# Patient Record
Sex: Male | Born: 1970
Health system: Southern US, Community
[De-identification: ages and names within clinical notes are randomized; demographics above are authoritative.]

## PROBLEM LIST (undated history)

## (undated) DIAGNOSIS — M1990 Unspecified osteoarthritis, unspecified site: Secondary | ICD-10-CM

## (undated) DIAGNOSIS — G4730 Sleep apnea, unspecified: Secondary | ICD-10-CM

## (undated) DIAGNOSIS — K219 Gastro-esophageal reflux disease without esophagitis: Secondary | ICD-10-CM

## (undated) DIAGNOSIS — C801 Malignant (primary) neoplasm, unspecified: Secondary | ICD-10-CM

## (undated) DIAGNOSIS — E785 Hyperlipidemia, unspecified: Secondary | ICD-10-CM

## (undated) DIAGNOSIS — F419 Anxiety disorder, unspecified: Secondary | ICD-10-CM

## (undated) DIAGNOSIS — I10 Essential (primary) hypertension: Secondary | ICD-10-CM

## (undated) DIAGNOSIS — R7303 Prediabetes: Secondary | ICD-10-CM

## (undated) DIAGNOSIS — S069X9A Unspecified intracranial injury with loss of consciousness of unspecified duration, initial encounter: Secondary | ICD-10-CM

---

## 2002-04-03 ENCOUNTER — Inpatient Hospital Stay (HOSPITAL_COMMUNITY)
Admission: AD | Admit: 2002-04-03 | Discharge: 2002-04-11 | Payer: Self-pay | Admitting: Physical Medicine & Rehabilitation

## 2002-04-03 ENCOUNTER — Encounter: Payer: Self-pay | Admitting: Physical Medicine & Rehabilitation

## 2002-04-10 ENCOUNTER — Encounter: Payer: Self-pay | Admitting: Otolaryngology

## 2002-04-11 ENCOUNTER — Inpatient Hospital Stay (HOSPITAL_COMMUNITY): Admission: AD | Admit: 2002-04-11 | Discharge: 2002-04-12 | Payer: Self-pay | Admitting: Otolaryngology

## 2002-04-23 ENCOUNTER — Ambulatory Visit (HOSPITAL_COMMUNITY): Admission: RE | Admit: 2002-04-23 | Discharge: 2002-04-23 | Payer: Self-pay | Admitting: Otolaryngology

## 2002-04-23 ENCOUNTER — Encounter: Payer: Self-pay | Admitting: Otolaryngology

## 2002-11-13 DIAGNOSIS — S069X9A Unspecified intracranial injury with loss of consciousness of unspecified duration, initial encounter: Secondary | ICD-10-CM

## 2002-11-13 DIAGNOSIS — S069XAA Unspecified intracranial injury with loss of consciousness status unknown, initial encounter: Secondary | ICD-10-CM

## 2002-11-13 HISTORY — DX: Unspecified intracranial injury with loss of consciousness of unspecified duration, initial encounter: S06.9X9A

## 2002-11-13 HISTORY — DX: Unspecified intracranial injury with loss of consciousness status unknown, initial encounter: S06.9XAA

## 2003-04-14 HISTORY — PX: COCHLEAR IMPLANT: SUR684
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
## 2014-09-16 NOTE — Patient Instructions (Addendum)
Brendan Cook  09/16/2014                           YOUR PROCEDURE IS SCHEDULED ON:  09/29/14                ENTER FROM [address] -  ENTER THRU EMERGENCY ENTRANCE                 FOLLOW  SIGNS TO SHORT STAY CENTER                 ARRIVE AT SHORT STAY AT:  5:45 am               CALL THIS NUMBER IF ANY PROBLEMS THE DAY OF SURGERY :               [phone]                                REMEMBER:   Do not eat food or drink liquids AFTER MIDNIGHT                  Take these medicines the morning of surgery with               A SIPS OF WATER :   VENLAFAXINE / FENOFIBRATE / ZYRTEC / FLUTICASONE NASAL SPRAY               BRING C PAP MASK AND TU BING TO HOSPITAL      Do not wear jewelry, make-up   Do not wear lotions, powders, or perfumes.   Do not shave legs or underarms 12 hrs. before surgery (men may shave face)  Do not bring valuables to the hospital.  Contacts, dentures or bridgework may not be worn into surgery.  Leave suitcase in the car. After surgery it may be brought to your room.  For patients admitted to the hospital more than one night, checkout time is            11:00 AM                                                         ________________________________________________________________________                                                                                                  Brendan Cook  Before surgery, you can play an important role.  Because skin is not sterile, your skin needs to be as free of germs as possible.  You can reduce the number of germs on your skin by washing with CHG (chlorahexidine gluconate) soap before surgery.  CHG is an antiseptic cleaner which kills germs and bonds with the skin to continue killing germs even after washing. Please DO NOT use if you have  an allergy to CHG or antibacterial soaps.  If your skin becomes reddened/irritated stop using the CHG and inform your nurse when you  arrive at Short Stay. Do not shave (including legs and underarms) for at least 48 hours prior to the first CHG shower.  You may shave your face. Please follow these instructions carefully:   1.  Shower with CHG Soap the night before surgery and the  morning of Surgery.   2.  If you choose to wash your hair, wash your hair first as usual with your  normal  Shampoo.   3.  After you shampoo, rinse your hair and body thoroughly to remove the  shampoo.                                         4.  Use CHG as you would any other liquid soap.  You can apply chg directly  to the skin and wash . Gently wash with scrungie or clean wascloth    5.  Apply the CHG Soap to your body ONLY FROM THE NECK DOWN.   Do not use on open                           Wound or open sores. Avoid contact with eyes, ears mouth and genitals (private parts).                        Genitals (private parts) with your normal soap.              6.  Wash thoroughly, paying special attention to the area where your surgery  will be performed.   7.  Thoroughly rinse your body with warm water from the neck down.   8.  DO NOT shower/wash with your normal soap after using and rinsing off  the CHG Soap .                9.  Pat yourself dry with a clean towel.             10.  Wear clean pajamas.             11.  Place clean sheets on your bed the night of your first shower and do not  sleep with pets.  Day of Surgery : Do not apply any lotions/deodorants the morning of surgery.  Please wear clean clothes to the hospital/surgery center.  FAILURE TO FOLLOW THESE INSTRUCTIONS MAY RESULT IN THE CANCELLATION OF YOUR SURGERY    PATIENT SIGNATURE_________________________________  ______________________________________________________________________     Brendan Cook  An incentive spirometer is a tool that can help keep your lungs clear and active. This tool measures how well you are filling your lungs with each breath.  Taking long deep breaths may help reverse or decrease the chance of developing breathing (pulmonary) problems (especially infection) following:  A long period of time when you are unable to move or be active. BEFORE THE PROCEDURE   If the spirometer includes an indicator to show your best effort, your nurse or respiratory therapist will set it to a desired goal.  If possible, sit up straight or lean slightly forward. Try not to slouch.  Hold the incentive spirometer in an upright position. INSTRUCTIONS FOR USE   Sit on the edge of your bed if possible,  or sit up as far as you can in bed or on a chair.  Hold the incentive spirometer in an upright position.  Breathe out normally.  Place the mouthpiece in your mouth and seal your lips tightly around it.  Breathe in slowly and as deeply as possible, raising the piston or the ball toward the top of the column.  Hold your breath for 3-5 seconds or for as long as possible. Allow the piston or ball to fall to the bottom of the column.  Remove the mouthpiece from your mouth and breathe out normally.  Rest for a few seconds and repeat Steps 1 through 7 at least 10 times every 1-2 hours when you are awake. Take your time and take a few normal breaths between deep breaths.  The spirometer may include an indicator to show your best effort. Use the indicator as a goal to work toward during each repetition.  After each set of 10 deep breaths, practice coughing to be sure your lungs are clear. If you have an incision (the cut made at the time of surgery), support your incision when coughing by placing a pillow or rolled up towels firmly against it. Once you are able to get out of bed, walk around indoors and cough well. You may stop using the incentive spirometer when instructed by your caregiver.  RISKS AND COMPLICATIONS  Take your time so you do not get dizzy or light-headed.  If you are in pain, you may need to take or ask for pain medication  before doing incentive spirometry. It is harder to take a deep breath if you are having pain. AFTER USE  Rest and breathe slowly and easily.  It can be helpful to keep track of a log of your progress. Your caregiver can provide you with a simple table to help with this. If you are using the spirometer at home, follow these instructions: [place] IF:   You are having difficultly using the spirometer.  You have trouble using the spirometer as often as instructed.  Your pain medication is not giving enough relief while using the spirometer.  You develop fever of 100.5 F (38.1 C) or higher. SEEK IMMEDIATE MEDICAL CARE IF:   You cough up bloody sputum that had not been present before.  You develop fever of 102 F (38.9 C) or greater.  You develop worsening pain at or near the incision site. MAKE SURE YOU:   Understand these instructions.  Will watch your condition.  Will get help right away if you are not doing well or get worse. Document Released: 03/12/2007 Document Revised: 01/22/2012 Document Reviewed: 05/13/2007 [employer] Patient Information 2014 [employer], [place].   ________________________________________________________________________  WHAT IS A BLOOD TRANSFUSION? Blood Transfusion Information  A transfusion is the replacement of blood or some of its parts. Blood is made up of multiple cells which provide different functions.  Red blood cells carry oxygen and are used for blood loss replacement.  White blood cells fight against infection.  Platelets control bleeding.  Plasma helps clot blood.  Other blood products are available for specialized needs, such as hemophilia or other clotting disorders. BEFORE THE TRANSFUSION  Who gives blood for transfusions?   Healthy volunteers who are fully evaluated to make sure their blood is safe. This is blood bank blood. Transfusion therapy is the safest it has ever been in the practice of medicine. Before blood is  taken from a donor, a complete history is taken to make sure that  person has no history of diseases nor engages in risky social behavior (examples are intravenous drug use or sexual activity with multiple partners). The donor's travel history is screened to minimize risk of transmitting infections, such as malaria. The donated blood is tested for signs of infectious diseases, such as HIV and hepatitis. The blood is then tested to be sure it is compatible with you in order to minimize the chance of a transfusion reaction. If you or a relative donates blood, this is often done in anticipation of surgery and is not appropriate for emergency situations. It takes many days to process the donated blood. RISKS AND COMPLICATIONS Although transfusion therapy is very safe and saves many lives, the main dangers of transfusion include:   Getting an infectious disease.  Developing a transfusion reaction. This is an allergic reaction to something in the blood you were given. Every precaution is taken to prevent this. The decision to have a blood transfusion has been considered carefully by your caregiver before blood is given. Blood is not given unless the benefits outweigh the risks. AFTER THE TRANSFUSION  Right after receiving a blood transfusion, you will usually feel much better and more energetic. This is especially true if your red blood cells have gotten low (anemic). The transfusion raises the level of the red blood cells which carry oxygen, and this usually causes an energy increase.  The nurse administering the transfusion will monitor you carefully for complications. HOME CARE INSTRUCTIONS  No special instructions are needed after a transfusion. You may find your energy is better. Speak with your caregiver about any limitations on activity for underlying diseases you may have. SEEK MEDICAL CARE IF:   Your condition is not improving after your transfusion.  You develop redness or irritation at the  intravenous (IV) site. SEEK IMMEDIATE MEDICAL CARE IF:  Any of the following symptoms occur over the next 12 hours:  Shaking chills.  You have a temperature by mouth above 102 F (38.9 C), not controlled by medicine.  Chest, back, or muscle pain.  People around you feel you are not acting correctly or are confused.  Shortness of breath or difficulty breathing.  Dizziness and fainting.  You get a rash or develop hives.  You have a decrease in urine output.  Your urine turns a dark color or changes to pink, red, or brown. Any of the following symptoms occur over the next 10 days:  You have a temperature by mouth above 102 F (38.9 C), not controlled by medicine.  Shortness of breath.  Weakness after normal activity.  The white part of the eye turns yellow (jaundice).  You have a decrease in the amount of urine or are urinating less often.  Your urine turns a dark color or changes to pink, red, or brown. Document Released: 10/27/2000 Document Revised: 01/22/2012 Document Reviewed: 06/15/2008 [facility] Patient Information 2014 [place], [place].  _______________________________________________________________________

## 2014-09-17 ENCOUNTER — Encounter (HOSPITAL_COMMUNITY)
Admission: RE | Admit: 2014-09-17 | Discharge: 2014-09-17 | Disposition: A | Discharge status: Home / Self Care | Payer: No Typology Code available for payment source | Source: Ambulatory Visit | Attending: Orthopedic Surgery | Admitting: Orthopedic Surgery

## 2014-09-17 ENCOUNTER — Encounter (HOSPITAL_COMMUNITY): Payer: Self-pay

## 2014-09-17 DIAGNOSIS — Z01812 Encounter for preprocedural laboratory examination: Secondary | ICD-10-CM | POA: Diagnosis present

## 2014-09-17 HISTORY — DX: Gastro-esophageal reflux disease without esophagitis: K21.9

## 2014-09-17 HISTORY — DX: Sleep apnea, unspecified: G47.30

## 2014-09-17 HISTORY — DX: Unspecified osteoarthritis, unspecified site: M19.90

## 2014-09-17 HISTORY — DX: Anxiety disorder, unspecified: F41.9

## 2014-09-17 HISTORY — DX: Hyperlipidemia, unspecified: E78.5

## 2014-09-17 HISTORY — DX: Unspecified intracranial injury with loss of consciousness of unspecified duration, initial encounter: S06.9X9A

## 2014-09-17 LAB — URINALYSIS, ROUTINE W REFLEX MICROSCOPIC
Bilirubin Urine: NEGATIVE
GLUCOSE, UA: NEGATIVE mg/dL
HGB URINE DIPSTICK: NEGATIVE
Ketones, ur: NEGATIVE mg/dL
Leukocytes, UA: NEGATIVE
Nitrite: NEGATIVE
PH: 6 (ref 5.0–8.0)
Protein, ur: NEGATIVE mg/dL
Specific Gravity, Urine: 1.012 (ref 1.005–1.030)
Urobilinogen, UA: 0.2 mg/dL (ref 0.0–1.0)

## 2014-09-17 LAB — SURGICAL PCR SCREEN
MRSA, PCR: NEGATIVE
Staphylococcus aureus: NEGATIVE

## 2014-09-17 LAB — CBC
HEMATOCRIT: 41 % (ref 39.0–52.0)
Hemoglobin: 12.8 g/dL — ABNORMAL LOW (ref 13.0–17.0)
MCH: 25.3 pg — AB (ref 26.0–34.0)
MCHC: 31.2 g/dL (ref 30.0–36.0)
MCV: 81 fL (ref 78.0–100.0)
PLATELETS: 279 10*3/uL (ref 150–400)
RBC: 5.06 MIL/uL (ref 4.22–5.81)
RDW: 15.8 % — ABNORMAL HIGH (ref 11.5–15.5)
WBC: 5.6 10*3/uL (ref 4.0–10.5)

## 2014-09-17 LAB — BASIC METABOLIC PANEL
Anion gap: 12 (ref 5–15)
BUN: 11 mg/dL (ref 6–23)
CO2: 24 meq/L (ref 19–32)
Calcium: 9.1 mg/dL (ref 8.4–10.5)
Chloride: 102 mEq/L (ref 96–112)
Creatinine, Ser: 0.86 mg/dL (ref 0.50–1.35)
GFR calc Af Amer: >90 mL/min (ref >90)
GLUCOSE: 102 mg/dL — AB (ref 70–99)
POTASSIUM: 4.1 meq/L (ref 3.7–5.3)
Sodium: 138 mEq/L (ref 137–147)

## 2014-09-17 LAB — PROTIME-INR
INR: 0.92 (ref 0.00–1.49)
Prothrombin Time: 12.5 seconds (ref 11.6–15.2)

## 2014-09-17 LAB — APTT: aPTT: 29 seconds (ref 24–37)

## 2014-09-28 NOTE — Anesthesia Preprocedure Evaluation (Addendum)
Anesthesia Evaluation  Patient identified by MRN, date of birth, ID band Patient awake    Reviewed: Allergy & Precautions, H&P , NPO status , Patient's Chart, lab work & pertinent test results  Airway Mallampati: III  TM Distance: >3 FB Neck ROM: full    Dental no notable dental hx. (+) Dental Advisory Given, Teeth Intact   Pulmonary sleep apnea and Continuous Positive Airway Pressure Ventilation ,  breath sounds clear to auscultation  Pulmonary exam normal       Cardiovascular Exercise Tolerance: Good negative cardio ROS  Rhythm:regular Rate:Normal     Neuro/Psych Traumatic brain injury 2004. Cochlear implant 2004 negative psych ROS   GI/Hepatic negative GI ROS, Neg liver ROS,   Endo/Other  negative endocrine ROS  Renal/GU negative Renal ROS  negative genitourinary   Musculoskeletal   Abdominal   Peds  Hematology negative hematology ROS (+)   Anesthesia Other Findings   Reproductive/Obstetrics negative OB ROS                           Anesthesia Physical Anesthesia Plan  ASA: III  Anesthesia Plan: Spinal   Post-op Pain Management:    Induction:   Airway Management Planned: Simple Face Mask  Additional Equipment:   Intra-op Plan:   Post-operative Plan:   Informed Consent: I have reviewed the patients History and Physical, chart, labs and discussed the procedure including the risks, benefits and alternatives for the proposed anesthesia with the patient or authorized representative who has indicated his/her understanding and acceptance.   Dental Advisory Given  Plan Discussed with: CRNA and Surgeon  Anesthesia Plan Comments:         Anesthesia Quick Evaluation

## 2014-09-29 ENCOUNTER — Inpatient Hospital Stay (HOSPITAL_COMMUNITY)
Admission: RE | Admit: 2014-09-29 | Discharge: 2014-09-30 | DRG: 470 | Disposition: A | Discharge status: Home Health | Payer: No Typology Code available for payment source | Source: Ambulatory Visit | Attending: Orthopedic Surgery | Admitting: Orthopedic Surgery

## 2014-09-29 ENCOUNTER — Inpatient Hospital Stay (HOSPITAL_COMMUNITY): Payer: No Typology Code available for payment source | Admitting: Anesthesiology

## 2014-09-29 ENCOUNTER — Encounter (HOSPITAL_COMMUNITY)
Admission: RE | Disposition: A | Discharge status: Home Health | Payer: Self-pay | Source: Ambulatory Visit | Attending: Orthopedic Surgery

## 2014-09-29 ENCOUNTER — Inpatient Hospital Stay (HOSPITAL_COMMUNITY): Payer: No Typology Code available for payment source

## 2014-09-29 ENCOUNTER — Encounter (HOSPITAL_COMMUNITY): Payer: Self-pay | Admitting: *Deleted

## 2014-09-29 DIAGNOSIS — M87852 Other osteonecrosis, left femur: Secondary | ICD-10-CM | POA: Diagnosis present

## 2014-09-29 DIAGNOSIS — Z96649 Presence of unspecified artificial hip joint: Secondary | ICD-10-CM

## 2014-09-29 DIAGNOSIS — M1612 Unilateral primary osteoarthritis, left hip: Principal | ICD-10-CM | POA: Diagnosis present

## 2014-09-29 DIAGNOSIS — F419 Anxiety disorder, unspecified: Secondary | ICD-10-CM | POA: Diagnosis present

## 2014-09-29 DIAGNOSIS — E785 Hyperlipidemia, unspecified: Secondary | ICD-10-CM | POA: Diagnosis present

## 2014-09-29 DIAGNOSIS — Z96642 Presence of left artificial hip joint: Secondary | ICD-10-CM

## 2014-09-29 DIAGNOSIS — K219 Gastro-esophageal reflux disease without esophagitis: Secondary | ICD-10-CM | POA: Diagnosis present

## 2014-09-29 DIAGNOSIS — E663 Overweight: Secondary | ICD-10-CM | POA: Diagnosis present

## 2014-09-29 DIAGNOSIS — M25552 Pain in left hip: Secondary | ICD-10-CM | POA: Diagnosis present

## 2014-09-29 HISTORY — PX: TOTAL HIP ARTHROPLASTY: SHX124

## 2014-09-29 LAB — TYPE AND SCREEN
ABO/RH(D): A POS
ANTIBODY SCREEN: NEGATIVE

## 2014-09-29 LAB — ABO/RH: ABO/RH(D): A POS

## 2014-09-29 SURGERY — ARTHROPLASTY, HIP, TOTAL, ANTERIOR APPROACH
Anesthesia: Spinal | Site: Hip | Laterality: Left

## 2014-09-29 MED ORDER — CELECOXIB 200 MG PO CAPS
200.0000 mg | ORAL_CAPSULE | Freq: Two times a day (BID) | ORAL | Status: DC
  Administered 2014-09-29 – 2014-09-30 (×2): 200 mg via ORAL
  Filled 2014-09-29 (×4): qty 1

## 2014-09-29 MED ORDER — ALPRAZOLAM 0.5 MG PO TABS: 0.5000 mg | ORAL_TABLET | Freq: Every evening | ORAL | Status: DC | PRN

## 2014-09-29 MED ORDER — ATORVASTATIN CALCIUM 20 MG PO TABS
20.0000 mg | ORAL_TABLET | Freq: Every day | ORAL | Status: DC
  Administered 2014-09-29 – 2014-09-30 (×2): 20 mg via ORAL
  Filled 2014-09-29 (×2): qty 1

## 2014-09-29 MED ORDER — MIDAZOLAM HCL 2 MG/2ML IJ SOLN
INTRAMUSCULAR | Status: AC
  Filled 2014-09-29: qty 2

## 2014-09-29 MED ORDER — PROPOFOL INFUSION 10 MG/ML OPTIME
INTRAVENOUS | Status: DC | PRN
  Administered 2014-09-29: 100 ug/kg/min via INTRAVENOUS

## 2014-09-29 MED ORDER — VENLAFAXINE HCL ER 150 MG PO CP24
150.0000 mg | ORAL_CAPSULE | Freq: Every day | ORAL | Status: DC
  Administered 2014-09-30: 150 mg via ORAL
  Filled 2014-09-29 (×2): qty 1

## 2014-09-29 MED ORDER — CEFAZOLIN SODIUM-DEXTROSE 2-3 GM-% IV SOLR
2.0000 g | Freq: Four times a day (QID) | INTRAVENOUS | Status: AC
  Administered 2014-09-29 (×2): 2 g via INTRAVENOUS
  Filled 2014-09-29 (×2): qty 50

## 2014-09-29 MED ORDER — CHLORHEXIDINE GLUCONATE 4 % EX LIQD: 60.0000 mL | Freq: Once | CUTANEOUS | Status: DC

## 2014-09-29 MED ORDER — LACTATED RINGERS IV SOLN: INTRAVENOUS | Status: DC | PRN

## 2014-09-29 MED ORDER — METHOCARBAMOL 500 MG PO TABS
500.0000 mg | ORAL_TABLET | Freq: Four times a day (QID) | ORAL | Status: DC | PRN
  Administered 2014-09-29 – 2014-09-30 (×2): 500 mg via ORAL
  Filled 2014-09-29 (×2): qty 1

## 2014-09-29 MED ORDER — TRANEXAMIC ACID 100 MG/ML IV SOLN
1000.0000 mg | Freq: Once | INTRAVENOUS | Status: AC
  Administered 2014-09-29: 1000 mg via INTRAVENOUS
  Filled 2014-09-29: qty 10

## 2014-09-29 MED ORDER — ONDANSETRON HCL 4 MG PO TABS: 4.0000 mg | ORAL_TABLET | Freq: Four times a day (QID) | ORAL | Status: DC | PRN

## 2014-09-29 MED ORDER — CEFAZOLIN SODIUM-DEXTROSE 2-3 GM-% IV SOLR
2.0000 g | INTRAVENOUS | Status: AC
  Administered 2014-09-29: 2000 mg via INTRAVENOUS

## 2014-09-29 MED ORDER — FENOFIBRATE 160 MG PO TABS
80.0000 mg | ORAL_TABLET | Freq: Every morning | ORAL | Status: DC
  Administered 2014-09-29 – 2014-09-30 (×2): 80 mg via ORAL
  Filled 2014-09-29 (×2): qty 0.5

## 2014-09-29 MED ORDER — FLUTICASONE PROPIONATE 50 MCG/ACT NA SUSP
1.0000 | Freq: Every day | NASAL | Status: DC
  Administered 2014-09-30: 1 via NASAL
  Filled 2014-09-29: qty 16

## 2014-09-29 MED ORDER — MENTHOL 3 MG MT LOZG
1.0000 | LOZENGE | OROMUCOSAL | Status: DC | PRN
  Filled 2014-09-29: qty 9

## 2014-09-29 MED ORDER — POLYETHYLENE GLYCOL 3350 17 G PO PACK: 17.0000 g | PACK | Freq: Every day | ORAL | Status: DC | PRN

## 2014-09-29 MED ORDER — DOCUSATE SODIUM 100 MG PO CAPS
100.0000 mg | ORAL_CAPSULE | Freq: Two times a day (BID) | ORAL | Status: DC
  Administered 2014-09-29 – 2014-09-30 (×2): 100 mg via ORAL

## 2014-09-29 MED ORDER — ONDANSETRON HCL 4 MG/2ML IJ SOLN
INTRAMUSCULAR | Status: DC | PRN
  Administered 2014-09-29: 4 mg via INTRAVENOUS

## 2014-09-29 MED ORDER — ALUM & MAG HYDROXIDE-SIMETH 200-200-20 MG/5ML PO SUSP: 30.0000 mL | ORAL | Status: DC | PRN

## 2014-09-29 MED ORDER — BUPIVACAINE HCL (PF) 0.5 % IJ SOLN
INTRAMUSCULAR | Status: DC | PRN
  Administered 2014-09-29: 3 mL

## 2014-09-29 MED ORDER — METOCLOPRAMIDE HCL 10 MG PO TABS: 5.0000 mg | ORAL_TABLET | Freq: Three times a day (TID) | ORAL | Status: DC | PRN

## 2014-09-29 MED ORDER — PROPOFOL 10 MG/ML IV BOLUS
INTRAVENOUS | Status: DC | PRN
  Administered 2014-09-29: 100 mg via INTRAVENOUS

## 2014-09-29 MED ORDER — DEXAMETHASONE SODIUM PHOSPHATE 10 MG/ML IJ SOLN
10.0000 mg | Freq: Once | INTRAMUSCULAR | Status: AC
  Administered 2014-09-29: 10 mg via INTRAVENOUS

## 2014-09-29 MED ORDER — ZOLPIDEM TARTRATE 5 MG PO TABS: 5.0000 mg | ORAL_TABLET | Freq: Every evening | ORAL | Status: DC | PRN

## 2014-09-29 MED ORDER — DIPHENHYDRAMINE HCL 25 MG PO CAPS: 25.0000 mg | ORAL_CAPSULE | Freq: Four times a day (QID) | ORAL | Status: DC | PRN

## 2014-09-29 MED ORDER — FENTANYL CITRATE 0.05 MG/ML IJ SOLN
INTRAMUSCULAR | Status: AC
  Filled 2014-09-29: qty 2

## 2014-09-29 MED ORDER — BISACODYL 10 MG RE SUPP: 10.0000 mg | Freq: Every day | RECTAL | Status: DC | PRN

## 2014-09-29 MED ORDER — SODIUM CHLORIDE 0.9 % IV SOLN
INTRAVENOUS | Status: DC
  Administered 2014-09-29: 13:00:00 via INTRAVENOUS
  Filled 2014-09-29 (×3): qty 1000

## 2014-09-29 MED ORDER — ONDANSETRON HCL 4 MG/2ML IJ SOLN: 4.0000 mg | Freq: Four times a day (QID) | INTRAMUSCULAR | Status: DC | PRN

## 2014-09-29 MED ORDER — PROPOFOL 10 MG/ML IV BOLUS
INTRAVENOUS | Status: AC
  Filled 2014-09-29: qty 20

## 2014-09-29 MED ORDER — ACETAMINOPHEN 650 MG RE SUPP
650.0000 mg | Freq: Four times a day (QID) | RECTAL | Status: DC | PRN
Start: 2014-09-29 — End: 2014-09-30

## 2014-09-29 MED ORDER — CEFAZOLIN SODIUM-DEXTROSE 2-3 GM-% IV SOLR
INTRAVENOUS | Status: AC
  Filled 2014-09-29: qty 50

## 2014-09-29 MED ORDER — DEXAMETHASONE SODIUM PHOSPHATE 10 MG/ML IJ SOLN
INTRAMUSCULAR | Status: AC
  Filled 2014-09-29: qty 1

## 2014-09-29 MED ORDER — PANTOPRAZOLE SODIUM 40 MG PO TBEC
40.0000 mg | DELAYED_RELEASE_TABLET | Freq: Every day | ORAL | Status: DC
  Administered 2014-09-30: 40 mg via ORAL
  Filled 2014-09-29: qty 1

## 2014-09-29 MED ORDER — LACTATED RINGERS IV SOLN: INTRAVENOUS | Status: DC

## 2014-09-29 MED ORDER — METHOCARBAMOL 1000 MG/10ML IJ SOLN
500.0000 mg | Freq: Four times a day (QID) | INTRAMUSCULAR | Status: DC | PRN
  Administered 2014-09-29: 500 mg via INTRAVENOUS
  Filled 2014-09-29 (×2): qty 5

## 2014-09-29 MED ORDER — ASPIRIN EC 325 MG PO TBEC
325.0000 mg | DELAYED_RELEASE_TABLET | Freq: Two times a day (BID) | ORAL | Status: DC
  Administered 2014-09-30: 325 mg via ORAL
  Filled 2014-09-29 (×3): qty 1

## 2014-09-29 MED ORDER — FENTANYL CITRATE 0.05 MG/ML IJ SOLN
INTRAMUSCULAR | Status: DC | PRN
  Administered 2014-09-29: 100 ug via INTRAVENOUS

## 2014-09-29 MED ORDER — LACTATED RINGERS IV SOLN
INTRAVENOUS | Status: DC
Start: 2014-09-29 — End: 2014-09-29

## 2014-09-29 MED ORDER — ONDANSETRON HCL 4 MG/2ML IJ SOLN
INTRAMUSCULAR | Status: AC
  Filled 2014-09-29: qty 2

## 2014-09-29 MED ORDER — ACETAMINOPHEN 500 MG PO TABS: 1000.0000 mg | ORAL_TABLET | Freq: Four times a day (QID) | ORAL | Status: DC

## 2014-09-29 MED ORDER — HYDROMORPHONE HCL 1 MG/ML IJ SOLN
0.5000 mg | INTRAMUSCULAR | Status: DC | PRN
  Administered 2014-09-29: 1 mg via INTRAVENOUS
  Administered 2014-09-29: 2 mg via INTRAVENOUS
  Filled 2014-09-29: qty 1
  Filled 2014-09-29: qty 2

## 2014-09-29 MED ORDER — METOCLOPRAMIDE HCL 5 MG/ML IJ SOLN: 5.0000 mg | Freq: Three times a day (TID) | INTRAMUSCULAR | Status: DC | PRN

## 2014-09-29 MED ORDER — PHENOL 1.4 % MT LIQD
1.0000 | OROMUCOSAL | Status: DC | PRN
Start: 2014-09-29 — End: 2014-09-30
  Filled 2014-09-29: qty 177

## 2014-09-29 MED ORDER — PHENYLEPHRINE HCL 10 MG/ML IJ SOLN
INTRAMUSCULAR | Status: AC
  Filled 2014-09-29: qty 1

## 2014-09-29 MED ORDER — BUPIVACAINE HCL (PF) 0.5 % IJ SOLN
INTRAMUSCULAR | Status: AC
  Filled 2014-09-29: qty 30

## 2014-09-29 MED ORDER — 0.9 % SODIUM CHLORIDE (POUR BTL) OPTIME
TOPICAL | Status: DC | PRN
  Administered 2014-09-29: 1000 mL

## 2014-09-29 MED ORDER — PHENYLEPHRINE 40 MCG/ML (10ML) SYRINGE FOR IV PUSH (FOR BLOOD PRESSURE SUPPORT)
PREFILLED_SYRINGE | INTRAVENOUS | Status: AC
  Filled 2014-09-29: qty 10

## 2014-09-29 MED ORDER — OMEPRAZOLE MAGNESIUM 20 MG PO TBEC: 20.0000 mg | DELAYED_RELEASE_TABLET | Freq: Every day | ORAL | Status: DC

## 2014-09-29 MED ORDER — LORATADINE 10 MG PO TABS
10.0000 mg | ORAL_TABLET | Freq: Every day | ORAL | Status: DC
  Administered 2014-09-29 – 2014-09-30 (×2): 10 mg via ORAL
  Filled 2014-09-29 (×2): qty 1

## 2014-09-29 MED ORDER — MIDAZOLAM HCL 5 MG/5ML IJ SOLN
INTRAMUSCULAR | Status: DC | PRN
  Administered 2014-09-29: 2 mg via INTRAVENOUS

## 2014-09-29 MED ORDER — ACETAMINOPHEN 325 MG PO TABS: 650.0000 mg | ORAL_TABLET | Freq: Four times a day (QID) | ORAL | Status: DC | PRN

## 2014-09-29 MED ORDER — LACTATED RINGERS IV SOLN
INTRAVENOUS | Status: DC | PRN
  Administered 2014-09-29 (×2): via INTRAVENOUS

## 2014-09-29 MED ORDER — DEXAMETHASONE SODIUM PHOSPHATE 10 MG/ML IJ SOLN
10.0000 mg | Freq: Once | INTRAMUSCULAR | Status: AC
  Administered 2014-09-30: 10 mg via INTRAVENOUS
  Filled 2014-09-29: qty 1

## 2014-09-29 MED ORDER — HYDROCODONE-ACETAMINOPHEN 7.5-325 MG PO TABS
1.0000 | ORAL_TABLET | ORAL | Status: DC
  Administered 2014-09-29 – 2014-09-30 (×6): 2 via ORAL
  Filled 2014-09-29 (×6): qty 2

## 2014-09-29 MED ORDER — HYDROMORPHONE HCL 1 MG/ML IJ SOLN: 0.2500 mg | INTRAMUSCULAR | Status: DC | PRN

## 2014-09-29 MED ORDER — MAGNESIUM CITRATE PO SOLN: 1.0000 | Freq: Once | ORAL | Status: AC | PRN

## 2014-09-29 SURGICAL SUPPLY — 43 items
BAG SPEC THK2 15X12 ZIP CLS (MISCELLANEOUS)
BAG ZIPLOCK 12X15 (MISCELLANEOUS) IMPLANT
CAPT HIP POROUS CUP W/CERAMAX ×2 IMPLANT
CAPT HIP PRIMARY STEM/CERM HD ×2 IMPLANT
COVER PERINEAL POST (MISCELLANEOUS) ×3 IMPLANT
DRAPE C-ARM 42X120 X-RAY (DRAPES) ×3 IMPLANT
DRAPE STERI IOBAN 125X83 (DRAPES) ×3 IMPLANT
DRAPE U-SHAPE 47X51 STRL (DRAPES) ×9 IMPLANT
DRSG AQUACEL AG ADV 3.5X10 (GAUZE/BANDAGES/DRESSINGS) ×3 IMPLANT
DURAPREP 26ML APPLICATOR (WOUND CARE) ×3 IMPLANT
ELECT BLADE TIP CTD 4 INCH (ELECTRODE) ×3 IMPLANT
ELECT REM PT RETURN 9FT ADLT (ELECTROSURGICAL) ×3
ELECTRODE REM PT RTRN 9FT ADLT (ELECTROSURGICAL) ×1 IMPLANT
FACESHIELD WRAPAROUND (MASK) ×12 IMPLANT
FACESHIELD WRAPAROUND OR TEAM (MASK) ×4 IMPLANT
GLOVE BIO SURGEON STRL SZ7.5 (GLOVE) ×2 IMPLANT
GLOVE BIOGEL M 7.0 STRL (GLOVE) ×2 IMPLANT
GLOVE BIOGEL M STRL SZ7.5 (GLOVE) ×2 IMPLANT
GLOVE BIOGEL PI IND STRL 6.5 (GLOVE) IMPLANT
GLOVE BIOGEL PI IND STRL 7.5 (GLOVE) ×1 IMPLANT
GLOVE BIOGEL PI INDICATOR 6.5 (GLOVE) ×2
GLOVE BIOGEL PI INDICATOR 7.5 (GLOVE) ×8
GLOVE ORTHO TXT STRL SZ7.5 (GLOVE) ×3 IMPLANT
GLOVE SURG SS PI 7.5 STRL IVOR (GLOVE) ×2 IMPLANT
GOWN BRE IMP PREV XXLGXLNG (GOWN DISPOSABLE) ×2 IMPLANT
GOWN STRL REUS W/ TWL XL LVL3 (GOWN DISPOSABLE) IMPLANT
GOWN STRL REUS W/TWL LRG LVL3 (GOWN DISPOSABLE) ×3 IMPLANT
GOWN STRL REUS W/TWL XL LVL3 (GOWN DISPOSABLE) ×6
HOLDER FOLEY CATH W/STRAP (MISCELLANEOUS) ×3 IMPLANT
KIT BASIN OR (CUSTOM PROCEDURE TRAY) ×3 IMPLANT
LIQUID BAND (GAUZE/BANDAGES/DRESSINGS) ×3 IMPLANT
PACK TOTAL JOINT (CUSTOM PROCEDURE TRAY) ×3 IMPLANT
SAW OSC TIP CART 19.5X105X1.3 (SAW) ×3 IMPLANT
SCREW 6.5MMX30MM (Screw) ×2 IMPLANT
SUT MNCRL AB 4-0 PS2 18 (SUTURE) ×3 IMPLANT
SUT VIC AB 1 CT1 36 (SUTURE) ×9 IMPLANT
SUT VIC AB 2-0 CT1 27 (SUTURE) ×6
SUT VIC AB 2-0 CT1 TAPERPNT 27 (SUTURE) ×2 IMPLANT
SUT VLOC 180 0 24IN GS25 (SUTURE) ×3 IMPLANT
TOWEL OR 17X26 10 PK STRL BLUE (TOWEL DISPOSABLE) ×3 IMPLANT
TOWEL OR NON WOVEN STRL DISP B (DISPOSABLE) ×2 IMPLANT
TRAY FOLEY CATH 16FRSI W/METER (SET/KITS/TRAYS/PACK) ×2 IMPLANT
WATER STERILE IRR 1500ML POUR (IV SOLUTION) ×3 IMPLANT

## 2014-09-29 NOTE — Plan of Care (Signed)
Problem: Phase I Progression Outcomes Goal: Hemodynamically stable Outcome: Completed/Met Date Met:  09/29/14     

## 2014-09-29 NOTE — Progress Notes (Signed)
RT checked with patient about CPAP. Patient stated that he would prefer not to wear one while here. RT encouraged patient to let RN know if he changed his mind.

## 2014-09-29 NOTE — Anesthesia Postprocedure Evaluation (Signed)
  Anesthesia Post-op Note  Patient: Brendan Cook  Procedure(s) Performed: Procedure(s) (LRB): LEFT TOTAL HIP ARTHROPLASTY ANTERIOR APPROACH (Left)  Patient Location: PACU  Anesthesia Type: General  Level of Consciousness: awake and alert   Airway and Oxygen Therapy: Patient Spontanous Breathing  Post-op Pain: mild  Post-op Assessment: Post-op Vital signs reviewed, Patient's Cardiovascular Status Stable, Respiratory Function Stable, Patent Airway and No signs of Nausea or vomiting  Last Vitals:  Filed Vitals:   09/29/14 1150  BP: 124/82  Pulse: 81  Temp: 36.7 C  Resp: 14    Post-op Vital Signs: stable   Complications: No apparent anesthesia complications

## 2014-09-29 NOTE — Anesthesia Procedure Notes (Signed)
Spinal Patient location during procedure: OR End time: 09/29/2014 8:41 AM Staffing Resident/CRNA: Noralyn Pick Performed by: anesthesiologist and resident/CRNA  Preanesthetic Checklist Completed: patient identified, site marked, surgical consent, pre-op evaluation, timeout performed, IV checked, risks and benefits discussed and monitors and equipment checked Spinal Block Patient position: sitting Prep: Betadine Patient monitoring: heart rate, continuous pulse ox and blood pressure Approach: midline Location: L2-3 Injection technique: single-shot Needle Needle type: Sprotte  Needle gauge: 24 G Needle length: 9 cm Assessment Sensory level: T6 Additional Notes Expiration date of kit checked and confirmed. Patient tolerated procedure well, without complications.

## 2014-09-29 NOTE — Progress Notes (Signed)
[facility]  [facility] is providing the following services: RW and Commode  If patient discharges after hours, please call [phone].   Linward Headland 09/29/2014, 1:51 PM

## 2014-09-29 NOTE — Plan of Care (Signed)
Problem: Phase I Progression Outcomes Goal: CMS/Neurovascular status WDL Outcome: Completed/Met Date Met:  09/29/14     

## 2014-09-29 NOTE — Progress Notes (Signed)
PT Cancellation Note  Patient Details Name: Brendan Cook MRN: [id number] DOB: 01/02/1971   Cancelled Treatment:    Reason Eval/Treat Not Completed: Pain limiting ability to participate (given IV meds, resting and in less pain)   Claretha Cooper 09/29/2014, 5:03 PM Tresa Endo PT [phone]

## 2014-09-29 NOTE — Interval H&P Note (Signed)
History and Physical Interval Note:  09/29/2014 7:01 AM  Brendan Cook  has presented today for surgery, with the diagnosis of LEFT HIP OA   The various methods of treatment have been discussed with the patient and family. After consideration of risks, benefits and other options for treatment, the patient has consented to  Procedure(s): LEFT TOTAL HIP ARTHROPLASTY ANTERIOR APPROACH (Left) as a surgical intervention .  The patient's history has been reviewed, patient examined, no change in status, stable for surgery.  I have reviewed the patient's chart and labs.  Questions were answered to the patient's satisfaction.     Mauri Pole
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
## 2014-09-29 NOTE — Transfer of Care (Signed)
Immediate Anesthesia Transfer of Care Note  Patient: Brendan Cook  Procedure(s) Performed: Procedure(s): LEFT TOTAL HIP ARTHROPLASTY ANTERIOR APPROACH (Left)  Patient Location: PACU  Anesthesia Type:Regional  Level of Consciousness: awake, alert  and oriented  Airway & Oxygen Therapy: Patient Spontanous Breathing and Patient connected to face mask oxygen  Post-op Assessment: Report given to PACU RN and Post -op Vital signs reviewed and stable  Post vital signs: Reviewed and stable  Complications: No apparent anesthesia complications

## 2014-09-30 DIAGNOSIS — E663 Overweight: Secondary | ICD-10-CM | POA: Diagnosis present

## 2014-09-30 LAB — CBC
HEMATOCRIT: 34.3 % — AB (ref 39.0–52.0)
Hemoglobin: 10.6 g/dL — ABNORMAL LOW (ref 13.0–17.0)
MCH: 24.8 pg — AB (ref 26.0–34.0)
MCHC: 30.9 g/dL (ref 30.0–36.0)
MCV: 80.3 fL (ref 78.0–100.0)
Platelets: 229 10*3/uL (ref 150–400)
RBC: 4.27 MIL/uL (ref 4.22–5.81)
RDW: 15.6 % — AB (ref 11.5–15.5)
WBC: 11.9 10*3/uL — ABNORMAL HIGH (ref 4.0–10.5)

## 2014-09-30 LAB — BASIC METABOLIC PANEL
Anion gap: 13 (ref 5–15)
BUN: 9 mg/dL (ref 6–23)
CALCIUM: 9.2 mg/dL (ref 8.4–10.5)
CHLORIDE: 103 meq/L (ref 96–112)
CO2: 24 mEq/L (ref 19–32)
CREATININE: 0.89 mg/dL (ref 0.50–1.35)
Glucose, Bld: 154 mg/dL — ABNORMAL HIGH (ref 70–99)
Potassium: 4.4 mEq/L (ref 3.7–5.3)
Sodium: 140 mEq/L (ref 137–147)

## 2014-09-30 MED ORDER — METHOCARBAMOL 500 MG PO TABS: 500.0000 mg | ORAL_TABLET | Freq: Four times a day (QID) | ORAL | Status: DC | PRN

## 2014-09-30 MED ORDER — HYDROCODONE-ACETAMINOPHEN 7.5-325 MG PO TABS: 1.0000 | ORAL_TABLET | ORAL | Status: DC | PRN

## 2014-09-30 MED ORDER — DSS 100 MG PO CAPS: 100.0000 mg | ORAL_CAPSULE | Freq: Two times a day (BID) | ORAL | Status: DC

## 2014-09-30 MED ORDER — POLYETHYLENE GLYCOL 3350 17 G PO PACK: 17.0000 g | PACK | Freq: Every day | ORAL | Status: DC | PRN

## 2014-09-30 MED ORDER — ASPIRIN 325 MG PO TBEC: 325.0000 mg | DELAYED_RELEASE_TABLET | Freq: Two times a day (BID) | ORAL | Status: AC

## 2014-09-30 NOTE — Care Management Note (Signed)
    Page 1 of 2   09/30/2014     11:22:09 AM CARE MANAGEMENT NOTE 09/30/2014  Patient:  Brendan Cook,Brendan Cook   Account Number:  [id number]  Date Initiated:  09/30/2014  Documentation initiated by:  [facility]  Subjective/Objective Assessment:   adm: LEFT TOTAL HIP ARTHROPLASTY ANTERIOR APPROACH (Left)     Action/Plan:   discharge planning   Anticipated DC Date:  09/30/2014   Anticipated DC Plan:  [place]         [facility] Choice  HOME HEALTH   Choice offered to / List presented to:  C-1 Patient   DME arranged  3-N-1  Vassie Moselle      DME agency  [place] arranged  [place]   Status of service:  Completed, signed off Medicare Important Message given?   (If response is "NO", the following Medicare IM given date fields will be blank) Date Medicare IM given:   Medicare IM given by:   Date Additional Medicare IM given:   Additional Medicare IM given by:    Discharge Disposition:  [place]  Per UR Regulation:    If discussed at Long Length of Stay Meetings, dates discussed:    Comments:  09/30/14 07:30 Cm met with pt and wife, Rodena Piety [phone] to offer choice of home health agency.  Pt and Rodena Piety request [facility] to render HHPT service.  CM spoke with PA who states this is fine. Rodena Piety provided fax number and Cm faxed order, F2F, OP note and PT eval to [facility].  CM called to verify reciept of Fax and  CM spoke with Olean Ree of [facility] who states they do NOT render [facility] services.  CM notified pt and anita who verbalize understanding.  CM verifies address to be:  [place] [address].  Referral called to [facility] for HHPT. Lecretia of [facility] has delivered 3n1 and rolling walker to room.  No other CM needs were communicated.  Mariane Masters, BSN, CM [phone].

## 2014-09-30 NOTE — Evaluation (Signed)
Physical Therapy Evaluation Patient Details Name: Brendan Cook MRN: [id number] DOB: [birth year] Today's Date: 09/30/2014   History of Present Illness  LDATHA  Clinical Impression  Patient reports  Very little pain, some burning L thigh. Pt. Will benefit from PT to address problems listed in note below. 1 more session for  Stairs.    Follow Up Recommendations Home health PT;Supervision/Assistance - 24 hour    Equipment Recommendations  Rolling walker with 5" wheels    Recommendations for Other Services       Precautions / Restrictions Precautions Precautions: Fall      Mobility  Bed Mobility Overal bed mobility: Needs Assistance Bed Mobility: Supine to Sit     Supine to sit: Min guard     General bed mobility comments: used sheet around foot to self assist  LLE, cues for trunk position for  optimum mobility.  Transfers Overall transfer level: Needs assistance Equipment used: Rolling walker (2 wheeled) Transfers: Sit to/from Stand Sit to Stand: Supervision         General transfer comment: cues for safety, hold onto RW as turns to back up to recliner. hand position and for LLE position  Ambulation/Gait Ambulation/Gait assistance: Min guard Ambulation Distance (Feet): 35 Feet Assistive device: Rolling walker (2 wheeled) Gait Pattern/deviations: Step-through pattern;Antalgic     General Gait Details: cues for sequence and  posture  Stairs            Wheelchair Mobility    Modified Rankin (Stroke Patients Only)       Balance                                             Pertinent Vitals/Pain Pain Assessment: 0-10 Pain Location: L thogh Pain Descriptors / Indicators: Burning;Tightness    Home Living Family/patient expects to be discharged to:: Private residence Living Arrangements: Spouse/significant other Available Help at Discharge: Family Type of Home: House Home Access: Stairs to enter Entrance Stairs-Rails:  None Technical brewer of Steps: 2 Home Layout: One level Home Equipment: None      Prior Function Level of Independence: Independent               Hand Dominance        Extremity/Trunk Assessment               Lower Extremity Assessment: LLE deficits/detail   LLE Deficits / Details: able to flex hip to 80 in supine, advances without  difficulty     Communication   Communication:  (Cochlear implant R)  Cognition Arousal/Alertness: Awake/alert Behavior During Therapy: WFL for tasks assessed/performed Overall Cognitive Status: Within Functional Limits for tasks assessed                      General Comments      Exercises Total Joint Exercises Ankle Circles/Pumps: AROM;Both Quad Sets: AROM;Both Short Arc Quad: AROM;Left Heel Slides: AROM;Left Hip ABduction/ADduction: AROM;Left Long Arc Quad: AROM;Left      Assessment/Plan    PT Assessment Patient needs continued PT services  PT Diagnosis Difficulty walking   PT Problem List Decreased strength;Decreased range of motion;Decreased activity tolerance;Decreased mobility;Decreased knowledge of precautions;Decreased safety awareness;Decreased knowledge of use of DME  PT Treatment Interventions DME instruction;Gait training;Stair training;Functional mobility training;Therapeutic activities;Therapeutic exercise   PT Goals (Current goals can be found in the Care Plan section) Acute Rehab PT  Goals Patient Stated Goal: to walk, go back to work PT Goal Formulation: With patient/family Time For Goal Achievement: 10/02/14 Potential to Achieve Goals: Good    Frequency 7X/week   Barriers to discharge        Co-evaluation               End of Session   Activity Tolerance: Patient tolerated treatment well Patient left: in chair;with call bell/phone within reach;with family/visitor present Nurse Communication: Mobility status         Time: 9201-0071 PT Time Calculation (min) (ACUTE  ONLY): 40 min   Charges:   PT Evaluation $Initial PT Evaluation Tier I: 1 Procedure PT Treatments $Gait Training: 8-22 mins $Therapeutic Exercise: 8-22 mins $Self Care/Home Management: 8-22   PT G Codes:          Claretha Cooper 09/30/2014, 9:44 AM

## 2014-09-30 NOTE — Progress Notes (Addendum)
     Subjective: 1 Day Post-Op Procedure(s) (LRB): LEFT TOTAL HIP ARTHROPLASTY ANTERIOR APPROACH (Left)   Seen by Dr. Alvan Dame. Patient reports pain as mild, pain controlled. No events throughout the night. Ready to be discharged home.  Objective:   VITALS:   Filed Vitals:   09/30/14 0550  BP: 104/62  Pulse: 80  Temp: 98.1 F (36.7 C)  Resp: 16    Dorsiflexion/Plantar flexion intact Incision: dressing C/D/I No cellulitis present Compartment soft  LABS  Recent Labs  09/30/14 0433  HGB 10.6*  HCT 34.3*  WBC 11.9*  PLT 229     Recent Labs  09/30/14 0433  NA 140  K 4.4  BUN 9  CREATININE 0.89  GLUCOSE 154*     Assessment/Plan: 1 Day Post-Op Procedure(s) (LRB): LEFT TOTAL HIP ARTHROPLASTY ANTERIOR APPROACH (Left) Foley cath d/c'ed yesterday Advance diet Up with therapy D/C IV fluids Discharge home with home health  Follow up in 2 weeks at [facility]. Follow up with OLIN,Elyse Prevo D in 2 weeks.  Contact information:  [facility] [address] [place] [phone]    Overweight (BMI 25-29.9) Estimated body mass index is 28.74 kg/(m^2) as calculated from the following:   Height as of this encounter: 5\' 11"  (1.803 m).   Weight as of this encounter: 93.441 kg (206 lb). Patient also counseled that weight may inhibit the healing process Patient counseled that losing weight will help with future health issues         [place] Pugh. Maritza Hosterman   PAC  09/30/2014, 8:29 AM

## 2014-09-30 NOTE — Progress Notes (Signed)
Physical Therapy Treatment Patient Details Name: Brendan Cook MRN: [id number] DOB: Aug 19, 1971 Today's Date: 09/30/2014    History of Present Illness LDATHA    PT Comments    Patient is progressing well, ready fro DC.  Follow Up Recommendations        Equipment Recommendations  Rolling walker with 5" wheels    Recommendations for Other Services       Precautions / Restrictions Precautions Precautions: Fall    Mobility  Bed Mobility Overal bed mobility: Modified Independent             General bed mobility comments: used sheet around foot to self assist  LLE, cues for trunk position for  optimum mobility.  Transfers   Equipment used: Rolling walker (2 wheeled) Transfers: Sit to/from Stand Sit to Stand: Supervision            Ambulation/Gait Ambulation/Gait assistance: Supervision Ambulation Distance (Feet): 400 Feet Assistive device: Rolling walker (2 wheeled) Gait Pattern/deviations: Step-through pattern     General Gait Details: cues for sequence and  posture   Stairs Stairs: Yes   Stair Management: No rails;Step to pattern;Backwards;With walker Number of Stairs: 2 General stair comments: spouse instructed in  assist on steps  Wheelchair Mobility    Modified Rankin (Stroke Patients Only)       Balance                                    Cognition Arousal/Alertness: Awake/alert                          Exercises      General Comments        Pertinent Vitals/Pain Pain Score: 1  Pain Location: L thigh Pain Descriptors / Indicators: Tightness Pain Intervention(s): Monitored during session;Premedicated before session    Home Living                      Prior Function            PT Goals (current goals can now be found in the care plan section) Progress towards PT goals: Progressing toward goals    Frequency       PT Plan Current plan remains appropriate    Co-evaluation              End of Session   Activity Tolerance: Patient tolerated treatment well       Time: 9509-3267 PT Time Calculation (min) (ACUTE ONLY): 13 min  Charges:  $Gait Training: 8-22 mins                    G Codes:      Claretha Cooper 09/30/2014, 3:37 PM

## 2014-09-30 NOTE — Plan of Care (Signed)
Problem: Consults Goal: Total Joint Replacement Patient Education See Patient Education Module for education specifics.  Outcome: Completed/Met Date Met:  09/30/14 Goal: Diagnosis- Total Joint Replacement Outcome: Completed/Met Date Met:  09/30/14 Primary Total Hip LEFT, Anterior Goal: Skin Care Protocol Initiated - if Braden Score 18 or less If consults are not indicated, leave blank or document N/A  Outcome: Not Applicable Date Met:  36/06/77 Goal: Nutrition Consult-if indicated Outcome: Not Applicable Date Met:  03/40/35 Goal: Diabetes Guidelines if Diabetic/Glucose > 140 If diabetic or lab glucose is > 140 mg/dl - Initiate Diabetes/Hyperglycemia Guidelines & Document Interventions  Outcome: Not Applicable Date Met:  24/81/85  Problem: Phase I Progression Outcomes Goal: Pain controlled with appropriate interventions Outcome: Completed/Met Date Met:  09/30/14 Goal: Dangle or out of bed evening of surgery Outcome: Completed/Met Date Met:  09/30/14 Goal: Initial discharge plan identified Outcome: Completed/Met Date Met:  09/30/14 Goal: Other Phase I Outcomes/Goals Outcome: Not Applicable Date Met:  90/93/11  Problem: Phase II Progression Outcomes Goal: Tolerating diet Outcome: Completed/Met Date Met:  09/30/14 Goal: Discharge plan established Outcome: Completed/Met Date Met:  09/30/14 Goal: Other Phase II Outcomes/Goals Outcome: Not Applicable Date Met:  21/62/44  Problem: Phase III Progression Outcomes Goal: Pain controlled on oral analgesia Outcome: Completed/Met Date Met:  09/30/14 Goal: Discharge plan remains appropriate-arrangements made Outcome: Completed/Met Date Met:  09/30/14

## 2014-09-30 NOTE — Evaluation (Signed)
Occupational Therapy Evaluation Patient Details Name: Kylen Schliep MRN: [id number] DOB: [birth year] Today's Date: 09/30/2014    History of Present Illness LDATHA   Clinical Impression   Pt doing well. All education completed. No further acute OT needs. Will need 3in1. Wife present for education.    Follow Up Recommendations  No OT follow up;Supervision/Assistance - 24 hour    Equipment Recommendations  3 in 1 bedside comode    Recommendations for Other Services       Precautions / Restrictions Precautions Precautions: Fall Restrictions Weight Bearing Restrictions: No      Mobility Bed Mobility            Transfers Overall transfer level: Needs assistance Equipment used: Rolling walker (2 wheeled) Transfers: Sit to/from Stand Sit to Stand: Min guard         General transfer comment: verbal cues for hand placement and LE management.     Balance                                            ADL Overall ADL's : Needs assistance/impaired Eating/Feeding: Independent;Sitting   Grooming: Wash/dry hands;Set up;Sitting   Upper Body Bathing: Set up;Sitting   Lower Body Bathing: Minimal assistance;Sit to/from stand   Upper Body Dressing : Set up;Sitting   Lower Body Dressing: Minimal assistance;Sit to/from stand   Toilet Transfer: Min guard;Ambulation;RW;BSC   Toileting- Water quality scientist and Hygiene: Min guard;Sit to/from stand   Tub/ Shower Transfer: Walk-in shower;Min Web designer ADL Comments: Educated pt and wife on AE options. Pt able to reach down and touch the top of L sock. Wife can assist for a week and then returns to work. Demonstrated all AE pieces and pt states he will likely obtain kit. Practiced 3in1 and shower transfers and explained how to adjust height of 3in1. Pt doing well. Reviewed sequence for LB dressing.     Vision                     Perception     Praxis      Pertinent  Vitals/Pain Pain Assessment: 0-10 Pain Score: 3  Pain Location: L thigh Pain Descriptors / Indicators: Tightness Pain Intervention(s): Repositioned;Ice applied     Hand Dominance     Extremity/Trunk Assessment Upper Extremity Assessment Upper Extremity Assessment: Overall WFL for tasks assessed          Communication Communication Communication:  (Cochlear implant R)   Cognition Arousal/Alertness: Awake/alert Behavior During Therapy: WFL for tasks assessed/performed Overall Cognitive Status: Within Functional Limits for tasks assessed                     General Comments       Exercises       Shoulder Instructions      Home Living Family/patient expects to be discharged to:: Private residence Living Arrangements: Spouse/significant other Available Help at Discharge: Family Type of Home: House Home Access: Stairs to enter Technical brewer of Steps: 2 Entrance Stairs-Rails: None Home Layout: One level     Bathroom Shower/Tub: Occupational psychologist:  (comfort height)     Home Equipment: None          Prior Functioning/Environment Level of Independence: Independent             OT Diagnosis: Generalized weakness  OT Problem List:     OT Treatment/Interventions:      OT Goals(Current goals can be found in the care plan section) Acute Rehab OT Goals Patient Stated Goal: return to independence OT Goal Formulation: With patient  OT Frequency:     Barriers to D/C:            Co-evaluation              End of Session Equipment Utilized During Treatment: Rolling walker  Activity Tolerance: Patient tolerated treatment well Patient left: in chair;with call bell/phone within reach;with family/visitor present   Time: 9923-4144 OT Time Calculation (min): 22 min Charges:  OT General Charges $OT Visit: 1 Procedure OT Evaluation $Initial OT Evaluation Tier I: 1 Procedure OT Treatments $Therapeutic Activity: 8-22  mins G-Codes:    Jules Schick  [phone] 09/30/2014, 10:01 AM

## 2014-09-30 NOTE — Progress Notes (Signed)
Utilization review completed.  

## 2014-10-01 ENCOUNTER — Encounter (HOSPITAL_COMMUNITY): Payer: Self-pay | Admitting: Orthopedic Surgery
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
## 2015-11-17 IMAGING — DX DG HIP 1V PORT*L*
1 series · 1 of 1 positions shown · non-contrast
Comparison: None.

CLINICAL DATA: Hip replacement surgery.

EXAM:
DG OPERATIVE left HIP 1-2 VIEWS
TECHNIQUE: Fluoroscopic spot image(s) were submitted for interpretation
post-operatively.

[hip x-table]
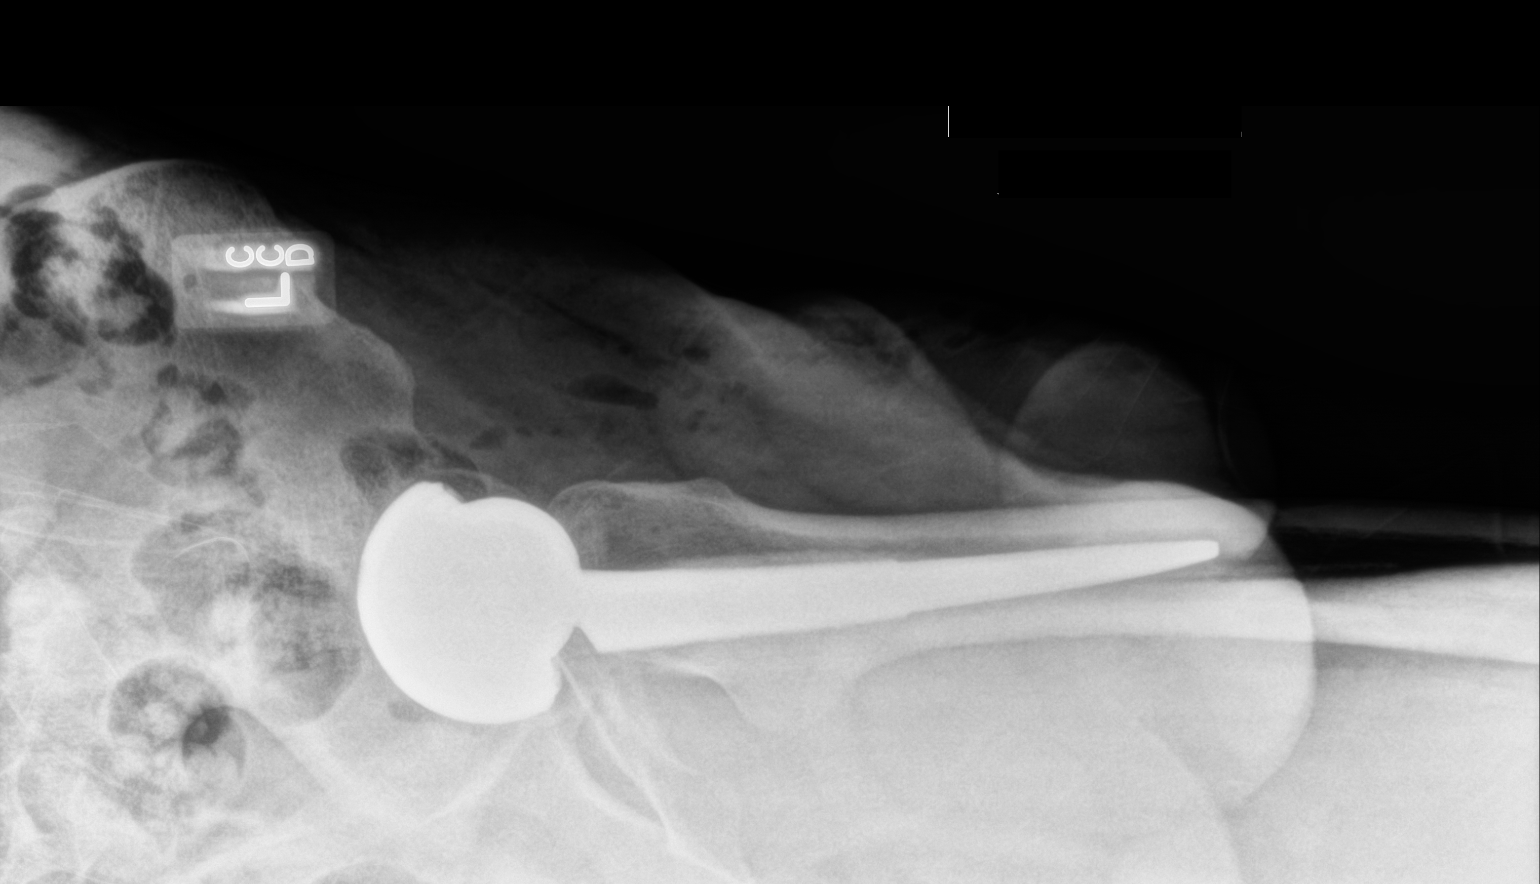

[1 of 1 positions shown; findings below may reference images not displayed]

FINDINGS: Left total hip arthroplasty components are well seated. No
complicating features. The right hip is normally located. The pubic
symphysis and SI joints are intact.
IMPRESSION: Well seated components of a total left hip arthroplasty without
complicating features.

## 2015-11-17 IMAGING — DX DG PORTABLE PELVIS
1 series · 1 of 1 positions shown · non-contrast
Comparison: None.

CLINICAL DATA: Hip replacement surgery.

EXAM:
DG OPERATIVE left HIP 1-2 VIEWS
TECHNIQUE: Fluoroscopic spot image(s) were submitted for interpretation
post-operatively.

[pelvis ap]
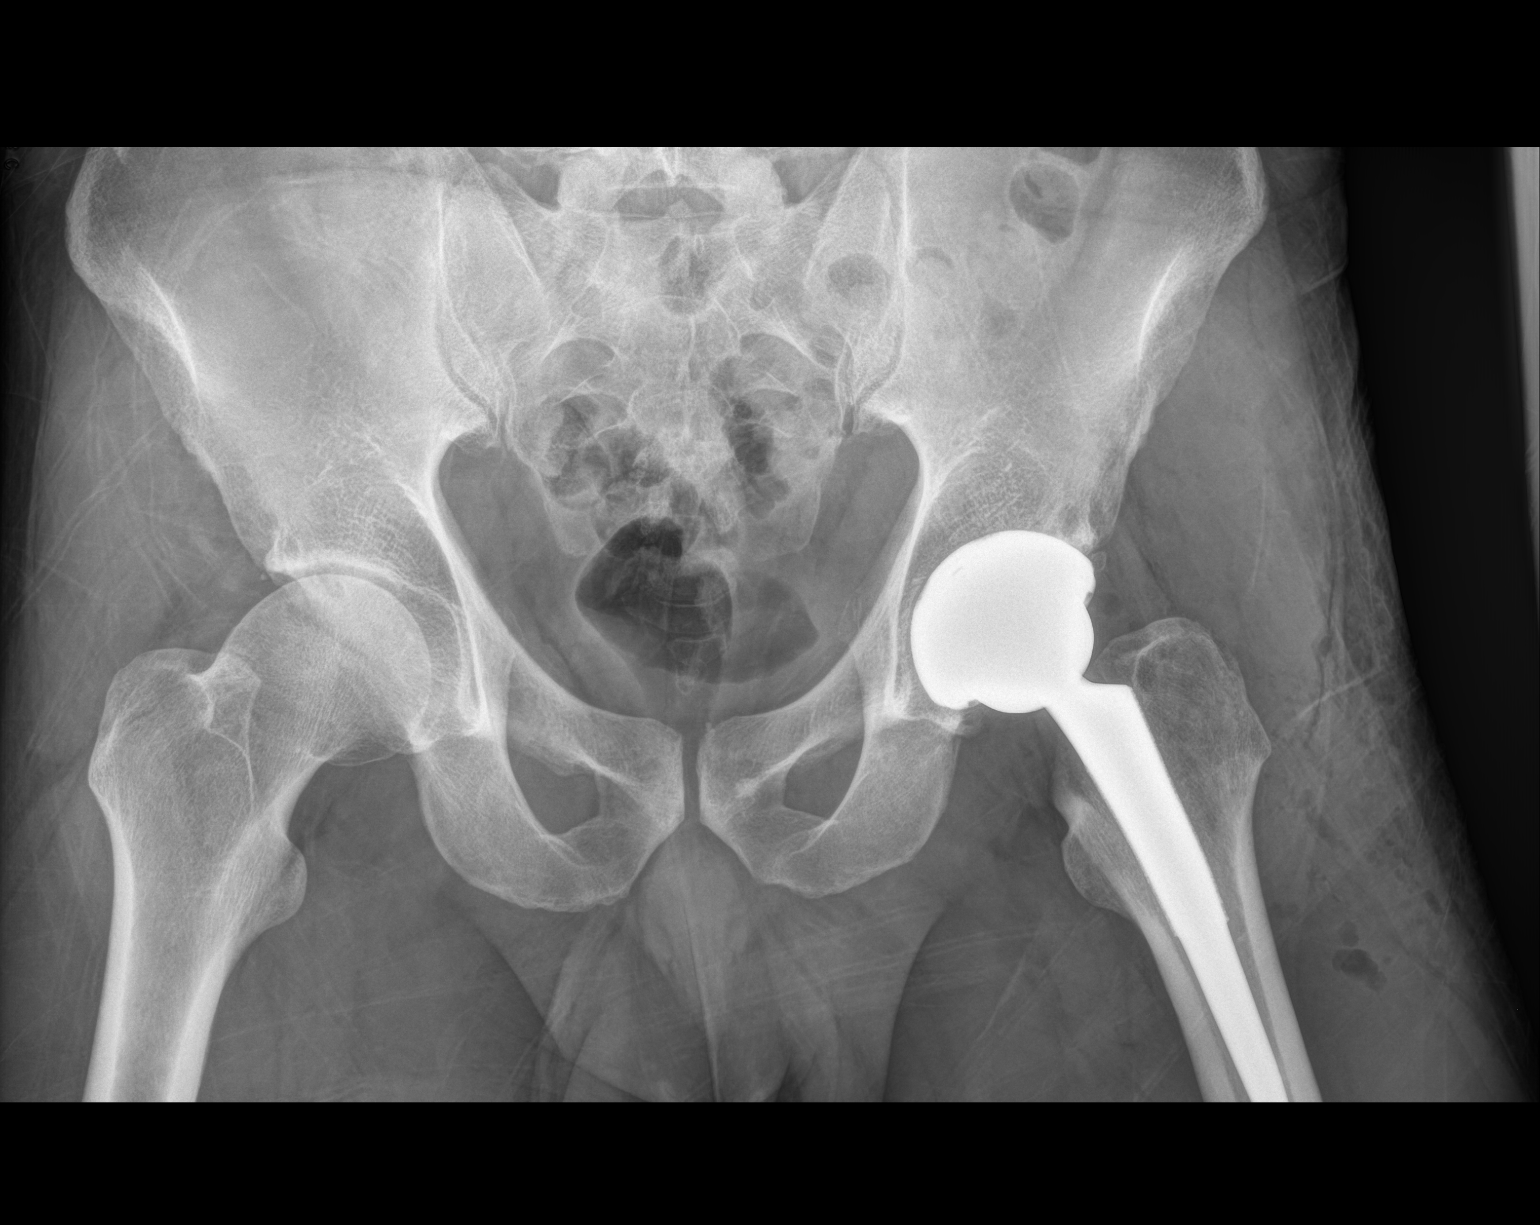

[1 of 1 positions shown; findings below may reference images not displayed]

FINDINGS: Left total hip arthroplasty components are well seated. No
complicating features. The right hip is normally located. The pubic
symphysis and SI joints are intact.
IMPRESSION: Well seated components of a total left hip arthroplasty without
complicating features.

## 2018-03-15 DIAGNOSIS — G4733 Obstructive sleep apnea (adult) (pediatric): Secondary | ICD-10-CM | POA: Diagnosis not present

## 2018-03-28 DIAGNOSIS — E7801 Familial hypercholesterolemia: Secondary | ICD-10-CM | POA: Diagnosis not present

## 2018-03-28 DIAGNOSIS — Z79899 Other long term (current) drug therapy: Secondary | ICD-10-CM | POA: Diagnosis not present

## 2018-06-13 DIAGNOSIS — L301 Dyshidrosis [pompholyx]: Secondary | ICD-10-CM | POA: Diagnosis not present

## 2018-06-13 DIAGNOSIS — D485 Neoplasm of uncertain behavior of skin: Secondary | ICD-10-CM | POA: Diagnosis not present

## 2018-06-19 ENCOUNTER — Other Ambulatory Visit: Payer: Self-pay | Admitting: *Deleted

## 2018-06-19 DIAGNOSIS — R002 Palpitations: Secondary | ICD-10-CM

## 2018-07-08 ENCOUNTER — Ambulatory Visit (INDEPENDENT_AMBULATORY_CARE_PROVIDER_SITE_OTHER): Payer: 59

## 2018-07-08 DIAGNOSIS — R002 Palpitations: Secondary | ICD-10-CM | POA: Diagnosis not present

## 2018-07-08 DIAGNOSIS — H02834 Dermatochalasis of left upper eyelid: Secondary | ICD-10-CM | POA: Diagnosis not present

## 2018-07-08 DIAGNOSIS — H02413 Mechanical ptosis of bilateral eyelids: Secondary | ICD-10-CM | POA: Diagnosis not present

## 2018-07-08 DIAGNOSIS — H02831 Dermatochalasis of right upper eyelid: Secondary | ICD-10-CM | POA: Diagnosis not present

## 2018-08-06 DIAGNOSIS — G4733 Obstructive sleep apnea (adult) (pediatric): Secondary | ICD-10-CM | POA: Diagnosis not present

## 2018-09-17 DIAGNOSIS — H02423 Myogenic ptosis of bilateral eyelids: Secondary | ICD-10-CM | POA: Diagnosis not present

## 2018-09-17 DIAGNOSIS — H02413 Mechanical ptosis of bilateral eyelids: Secondary | ICD-10-CM | POA: Diagnosis not present

## 2018-09-17 DIAGNOSIS — H02831 Dermatochalasis of right upper eyelid: Secondary | ICD-10-CM | POA: Diagnosis not present

## 2018-09-17 DIAGNOSIS — H57813 Brow ptosis, bilateral: Secondary | ICD-10-CM | POA: Diagnosis not present

## 2018-09-26 DIAGNOSIS — E291 Testicular hypofunction: Secondary | ICD-10-CM | POA: Diagnosis not present

## 2018-09-27 DIAGNOSIS — E291 Testicular hypofunction: Secondary | ICD-10-CM | POA: Diagnosis not present

## 2018-09-30 DIAGNOSIS — R6882 Decreased libido: Secondary | ICD-10-CM | POA: Diagnosis not present

## 2018-09-30 DIAGNOSIS — E291 Testicular hypofunction: Secondary | ICD-10-CM | POA: Diagnosis not present

## 2018-09-30 DIAGNOSIS — R5383 Other fatigue: Secondary | ICD-10-CM | POA: Diagnosis not present

## 2018-10-02 DIAGNOSIS — Z79899 Other long term (current) drug therapy: Secondary | ICD-10-CM | POA: Diagnosis not present

## 2018-10-02 DIAGNOSIS — E7801 Familial hypercholesterolemia: Secondary | ICD-10-CM | POA: Diagnosis not present

## 2018-10-14 DIAGNOSIS — H903 Sensorineural hearing loss, bilateral: Secondary | ICD-10-CM | POA: Diagnosis not present

## 2018-10-14 DIAGNOSIS — H70091 Acute mastoiditis with other complications, right ear: Secondary | ICD-10-CM | POA: Diagnosis not present

## 2018-10-16 ENCOUNTER — Ambulatory Visit
Admission: RE | Admit: 2018-10-16 | Discharge: 2018-10-16 | Disposition: A | Discharge status: Home / Self Care | Payer: 59 | Source: Ambulatory Visit | Attending: Otolaryngology | Admitting: Otolaryngology

## 2018-10-16 ENCOUNTER — Other Ambulatory Visit: Payer: Self-pay | Admitting: Otolaryngology

## 2018-10-16 DIAGNOSIS — H70091 Acute mastoiditis with other complications, right ear: Secondary | ICD-10-CM

## 2018-10-16 DIAGNOSIS — H6691 Otitis media, unspecified, right ear: Secondary | ICD-10-CM | POA: Diagnosis not present

## 2018-10-16 DIAGNOSIS — H903 Sensorineural hearing loss, bilateral: Secondary | ICD-10-CM

## 2018-10-16 MED ORDER — IOPAMIDOL (ISOVUE-300) INJECTION 61%
75.0000 mL | Freq: Once | INTRAVENOUS | Status: AC | PRN
  Administered 2018-10-16: 75 mL via INTRAVENOUS

## 2018-10-17 ENCOUNTER — Other Ambulatory Visit: Payer: Self-pay | Admitting: Otolaryngology

## 2018-10-17 DIAGNOSIS — H70091 Acute mastoiditis with other complications, right ear: Secondary | ICD-10-CM

## 2018-10-17 DIAGNOSIS — H903 Sensorineural hearing loss, bilateral: Secondary | ICD-10-CM

## 2018-10-18 DIAGNOSIS — H70091 Acute mastoiditis with other complications, right ear: Secondary | ICD-10-CM | POA: Diagnosis not present

## 2018-10-18 DIAGNOSIS — H903 Sensorineural hearing loss, bilateral: Secondary | ICD-10-CM | POA: Diagnosis not present

## 2018-10-25 ENCOUNTER — Other Ambulatory Visit: Payer: Self-pay | Admitting: Otolaryngology

## 2018-10-25 DIAGNOSIS — H7011 Chronic mastoiditis, right ear: Secondary | ICD-10-CM | POA: Diagnosis not present

## 2018-10-25 DIAGNOSIS — H7091 Unspecified mastoiditis, right ear: Secondary | ICD-10-CM | POA: Diagnosis not present

## 2018-10-25 DIAGNOSIS — H903 Sensorineural hearing loss, bilateral: Secondary | ICD-10-CM | POA: Diagnosis not present

## 2018-10-25 DIAGNOSIS — H70091 Acute mastoiditis with other complications, right ear: Secondary | ICD-10-CM | POA: Diagnosis not present

## 2018-10-30 DIAGNOSIS — R5383 Other fatigue: Secondary | ICD-10-CM | POA: Diagnosis not present

## 2018-10-30 DIAGNOSIS — E291 Testicular hypofunction: Secondary | ICD-10-CM | POA: Diagnosis not present

## 2018-10-30 DIAGNOSIS — R6882 Decreased libido: Secondary | ICD-10-CM | POA: Diagnosis not present

## 2018-11-04 DIAGNOSIS — R5383 Other fatigue: Secondary | ICD-10-CM | POA: Diagnosis not present

## 2018-11-04 DIAGNOSIS — E291 Testicular hypofunction: Secondary | ICD-10-CM | POA: Diagnosis not present

## 2018-11-04 DIAGNOSIS — R6882 Decreased libido: Secondary | ICD-10-CM | POA: Diagnosis not present

## 2018-12-16 DIAGNOSIS — R6882 Decreased libido: Secondary | ICD-10-CM | POA: Diagnosis not present

## 2018-12-16 DIAGNOSIS — E291 Testicular hypofunction: Secondary | ICD-10-CM | POA: Diagnosis not present

## 2018-12-16 DIAGNOSIS — R5383 Other fatigue: Secondary | ICD-10-CM | POA: Diagnosis not present

## 2018-12-20 DIAGNOSIS — R972 Elevated prostate specific antigen [PSA]: Secondary | ICD-10-CM | POA: Diagnosis not present

## 2018-12-20 DIAGNOSIS — E291 Testicular hypofunction: Secondary | ICD-10-CM | POA: Diagnosis not present

## 2018-12-20 DIAGNOSIS — I10 Essential (primary) hypertension: Secondary | ICD-10-CM | POA: Diagnosis not present

## 2019-01-09 DIAGNOSIS — I10 Essential (primary) hypertension: Secondary | ICD-10-CM | POA: Diagnosis not present

## 2019-01-09 DIAGNOSIS — E785 Hyperlipidemia, unspecified: Secondary | ICD-10-CM | POA: Diagnosis not present

## 2019-01-09 DIAGNOSIS — R972 Elevated prostate specific antigen [PSA]: Secondary | ICD-10-CM | POA: Diagnosis not present

## 2019-01-09 DIAGNOSIS — E291 Testicular hypofunction: Secondary | ICD-10-CM | POA: Diagnosis not present

## 2019-01-09 DIAGNOSIS — R5382 Chronic fatigue, unspecified: Secondary | ICD-10-CM | POA: Diagnosis not present

## 2019-01-16 DIAGNOSIS — K219 Gastro-esophageal reflux disease without esophagitis: Secondary | ICD-10-CM | POA: Diagnosis not present

## 2019-01-16 DIAGNOSIS — I10 Essential (primary) hypertension: Secondary | ICD-10-CM | POA: Diagnosis not present

## 2019-01-16 DIAGNOSIS — J309 Allergic rhinitis, unspecified: Secondary | ICD-10-CM | POA: Diagnosis not present

## 2019-12-04 IMAGING — CT CT TEMPORAL BONES WO/W CM
4 of 8 series · 12 of 30 positions shown, 13 images · IV contrast (iopamidol)
Comparison: 08/20/2016 CT head and facial.

CLINICAL DATA: 47 y/o M; right ear infections since [REDACTED] with
pressure. History of bilateral cochlear implant.

EXAM:
CT TEMPORAL BONES WITHOUT AND WITH CONTRAST
TECHNIQUE: Axial and coronal plane CT imaging of the petrous temporal bones was
performed with thin-collimation image reconstruction before and
following intravenous contrast administration. Multiplanar CT image
reconstructions were also generated.
CONTRAST:  75mL VHYD3V-3HH IOPAMIDOL (VHYD3V-3HH) INJECTION 61%

[Series 9: axial mag right · axial · 0.20mm/px · z∈[-96,-42]mm · 3 of 182 slices shown, 4 images]
[im 46/182  brain]
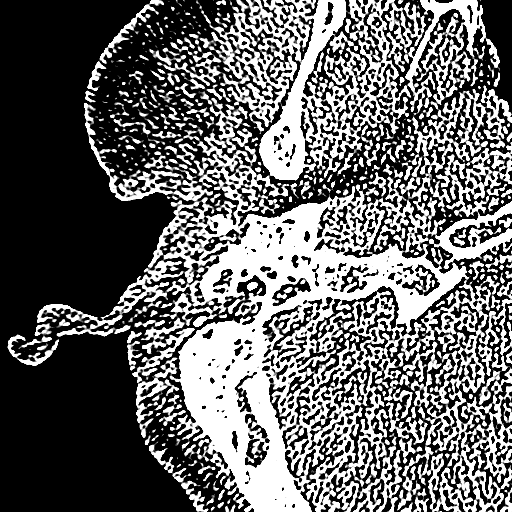
[im 46/182  bone]
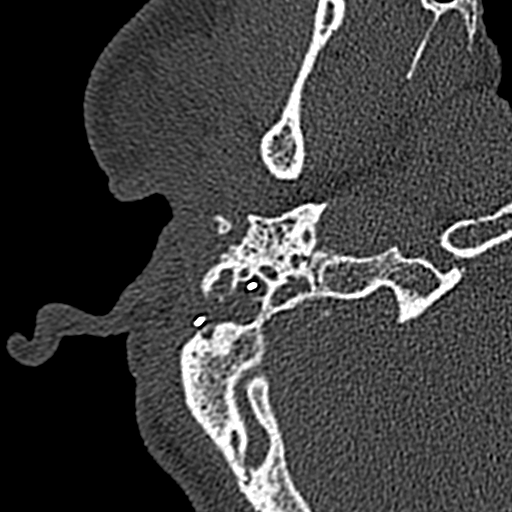
[im 91/182  bone]
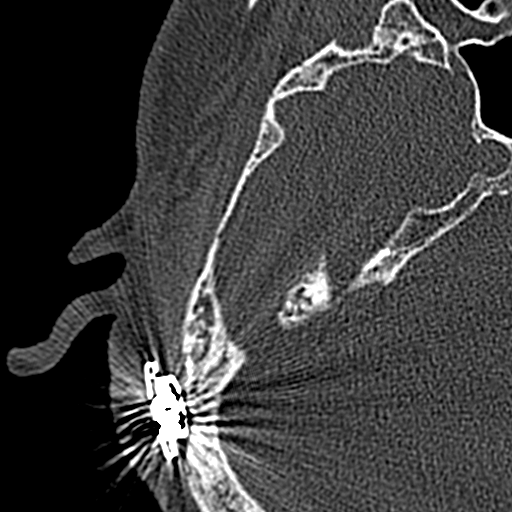
[im 136/182  bone]
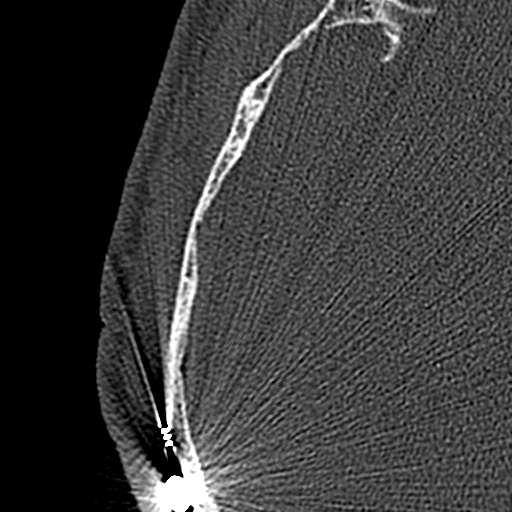

[Series 10: axial mag left · axial · 0.20mm/px · z∈[-96,-42]mm · 3 of 182 slices shown]
[im 46/182  bone]
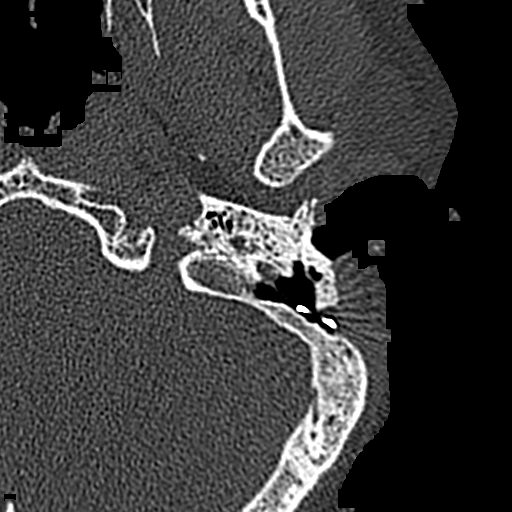
[im 91/182  bone]
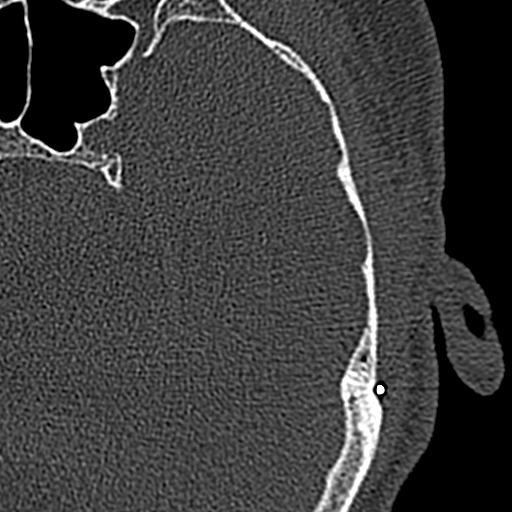
[im 136/182  bone]
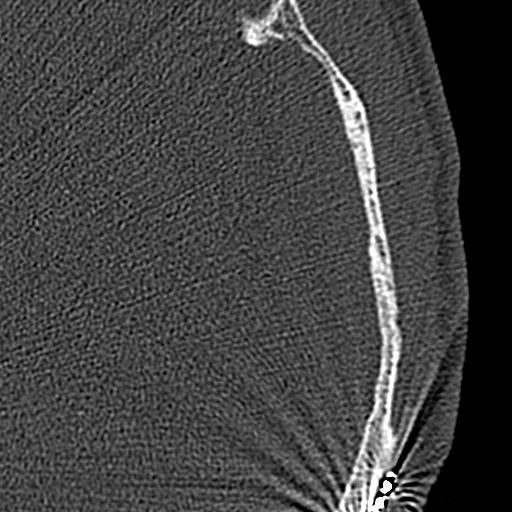

[Series 15: axial mag right w · axial · 0.21mm/px · z∈[-96,-42]mm · 3 of 182 slices shown]
[im 46/182  bone]
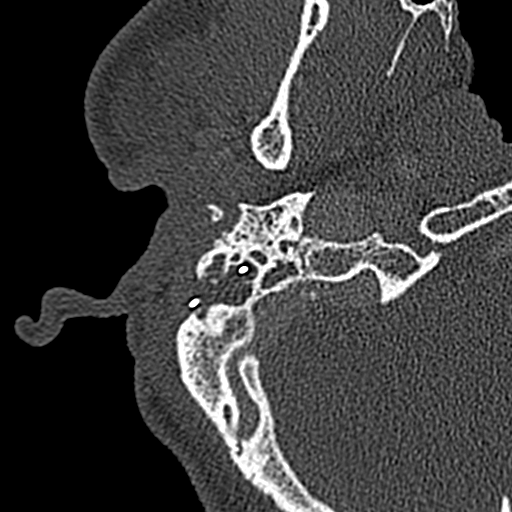
[im 91/182  bone]
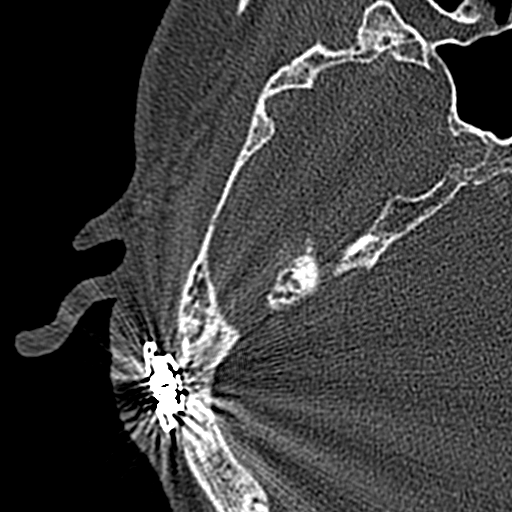
[im 136/182  bone]
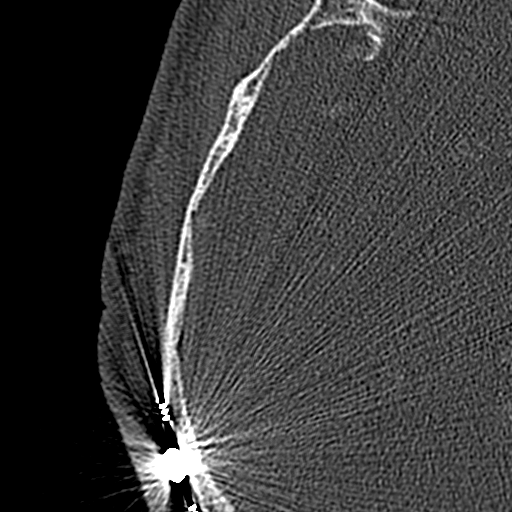

[Series 16: axial mag left w · axial · 0.21mm/px · z∈[-96,-42]mm · 3 of 182 slices shown]
[im 46/182  bone]
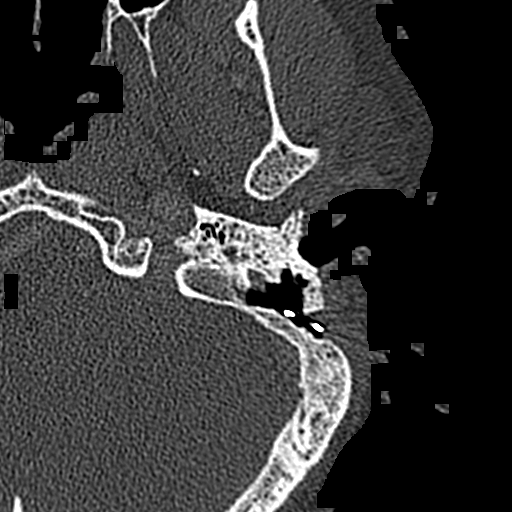
[im 91/182  bone]
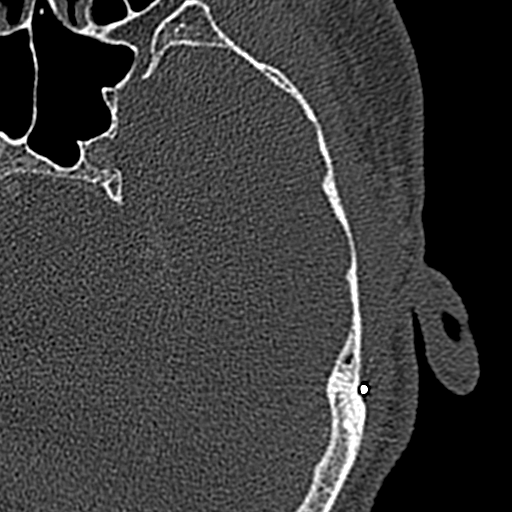
[im 136/182  bone]
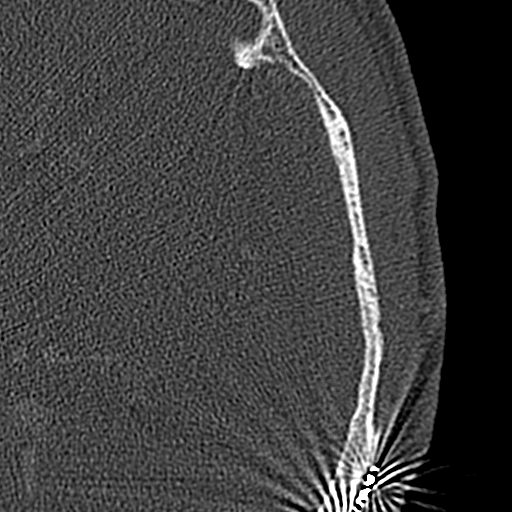

[12 of 30 positions shown; findings below may reference images not displayed]

FINDINGS: Right ear: Complete opacification. Wall down mastoidectomy and
cochlear implant. The course of the cochlear implant electrode is
stable coiling within the cochlea. Tympanic membrane not visualized.
No ossicles identified. Normal course of the facial nerve. Normal
semicircular canals and vestibulocochlear apparatus. Near complete
opacification of the mastoidectomy bowl, external auditory canal,
mastoid air cells, and the middle ear cavity with fluid levels.
Small focal dehiscence within the tegmen tympani (series 11, image
84), age indeterminate. Bulging of the lateral wall of the
mastoidectomy bowl into the soft tissues of the periauricular scalp
(series 6, image 21) indicating contents under pressure, no discrete
scalp abscess identified. No epidural extension or dural venous
sinus thrombosis identified.

Left ear: Normal external auditory canal. Stable coarse of the
cochlear implant electrode coiling within the cochlear.
Mastoidectomy with partial resection of the external auditory canal
bony wall. Normal aeration of the mastoidectomy bowl, mastoid air
cells, and middle ear cavity. Normal course of the facial nerve.
Normal semicircular canals and vestibulocochlear apparatus.

Other: Paranasal sinuses are normally aerated. No acute process
identified in the intracranial compartment. Visualized orbits are
unremarkable.
IMPRESSION: 1. Near complete opacification of the right mastoidectomy bowl,
external auditory canal, mastoid air cells, and middle ear cavity
with fluid levels compatible with acute otitis media/mastoiditis. No
epidural extension or dural venous sinus thrombosis identified. No
discrete scalp abscess.
2. Small focal dehiscence within the right tegmen tympani, age
indeterminate.
3. Stable intact cochlear implant leads. Bilateral mastoidectomy
postsurgical changes.
4. Stable appearance of inner ear structures given differences in
technique.
5. Normal aeration of the left mastoid air cells and middle ear
cavity.

## 2022-01-17 ENCOUNTER — Other Ambulatory Visit: Payer: Self-pay | Admitting: Urology

## 2022-01-17 DIAGNOSIS — R972 Elevated prostate specific antigen [PSA]: Secondary | ICD-10-CM

## 2022-02-05 ENCOUNTER — Ambulatory Visit
Admission: RE | Admit: 2022-02-05 | Discharge: 2022-02-05 | Disposition: A | Discharge status: Home / Self Care | Payer: 59 | Source: Ambulatory Visit | Attending: Urology | Admitting: Urology

## 2022-02-05 ENCOUNTER — Other Ambulatory Visit: Payer: Self-pay

## 2022-02-05 DIAGNOSIS — R972 Elevated prostate specific antigen [PSA]: Secondary | ICD-10-CM

## 2022-02-10 ENCOUNTER — Other Ambulatory Visit (HOSPITAL_COMMUNITY): Payer: Self-pay | Admitting: Urology

## 2022-02-10 DIAGNOSIS — R972 Elevated prostate specific antigen [PSA]: Secondary | ICD-10-CM

## 2022-05-29 ENCOUNTER — Other Ambulatory Visit: Payer: Self-pay

## 2022-05-29 DIAGNOSIS — C61 Malignant neoplasm of prostate: Secondary | ICD-10-CM

## 2022-06-13 NOTE — Progress Notes (Signed)
GU Location of Tumor / Histology: Prostate Ca  If Prostate Cancer, Gleason Score is (3 + 3) and PSA is (4.6 as of 03/2022)  Biopsies      Past/Anticipated interventions by urology, if any:   Past/Anticipated interventions by medical oncology, if any:   Weight changes, if any: No  IPSS:  5 SHIM:  24  Bowel/Bladder complaints, if any:  No  Nausea/Vomiting, if any: No Pain issues, if any:  0  SAFETY ISSUES: Prior radiation? No Pacemaker/ICD? No Possible current pregnancy? Male Is the patient on methotrexate? No  Current Complaints / other details:
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
## 2022-06-19 ENCOUNTER — Ambulatory Visit
Admission: RE | Admit: 2022-06-19 | Discharge: 2022-06-19 | Disposition: A | Discharge status: Home / Self Care | Payer: 59 | Source: Ambulatory Visit | Attending: Radiation Oncology | Admitting: Radiation Oncology

## 2022-06-19 VITALS — BP 138/95 | HR 91 | Temp 97.2°F | Resp 18 | Ht 69.0 in | Wt 219.8 lb

## 2022-06-19 DIAGNOSIS — C61 Malignant neoplasm of prostate: Secondary | ICD-10-CM | POA: Insufficient documentation

## 2022-06-19 DIAGNOSIS — K219 Gastro-esophageal reflux disease without esophagitis: Secondary | ICD-10-CM | POA: Insufficient documentation

## 2022-06-19 DIAGNOSIS — Z79899 Other long term (current) drug therapy: Secondary | ICD-10-CM | POA: Insufficient documentation

## 2022-06-19 DIAGNOSIS — E785 Hyperlipidemia, unspecified: Secondary | ICD-10-CM | POA: Diagnosis not present

## 2022-06-19 DIAGNOSIS — G4730 Sleep apnea, unspecified: Secondary | ICD-10-CM | POA: Insufficient documentation

## 2022-06-19 NOTE — Progress Notes (Signed)
Introduced myself to the patient as the prostate nurse navigator.  No barriers to care identified at this time.  He is here to discuss his radiation treatment options.  I gave him my business card and asked him to call me with questions or concerns.  Verbalized understanding.  ?

## 2022-06-23 ENCOUNTER — Telehealth: Payer: Self-pay | Admitting: *Deleted

## 2022-06-23 NOTE — Telephone Encounter (Signed)
CALLED PATIENT TO UPDATE, SPOKE WITH PATIENT 

## 2022-07-24 NOTE — Progress Notes (Signed)
RN left message with patient to call back to assess any questions or concerns prior to proceeding with brachytherapy scheduled with Dr. Tammi Klippel and Dr. Nila Nephew for 08/2022.

## 2022-08-03 NOTE — Progress Notes (Signed)
RN left message with patient with upcoming appointment dates and contact number to call back.

## 2022-08-09 ENCOUNTER — Telehealth: Payer: Self-pay | Admitting: *Deleted

## 2022-08-09 NOTE — Telephone Encounter (Signed)
CALLED PATIENT TO REMIND OF PRE-SEED APPTS. FOR 08-10-22 - ARRIVAL TIME- 7:45 AM @ [facility], SPOKE WITH PATIENT AND HE IS AWARE OF THESE APPTS.

## 2022-08-10 ENCOUNTER — Encounter: Payer: Self-pay | Admitting: Urology

## 2022-08-10 ENCOUNTER — Encounter (HOSPITAL_COMMUNITY)
Admission: RE | Admit: 2022-08-10 | Discharge: 2022-08-10 | Disposition: A | Discharge status: Home / Self Care | Payer: 59 | Source: Ambulatory Visit | Attending: Urology | Admitting: Urology

## 2022-08-10 ENCOUNTER — Ambulatory Visit
Admission: RE | Admit: 2022-08-10 | Discharge: 2022-08-10 | Disposition: A | Discharge status: Home / Self Care | Payer: 59 | Source: Ambulatory Visit | Attending: Radiation Oncology | Admitting: Radiation Oncology

## 2022-08-10 ENCOUNTER — Ambulatory Visit
Admission: RE | Admit: 2022-08-10 | Discharge: 2022-08-10 | Disposition: A | Discharge status: Home / Self Care | Payer: 59 | Source: Ambulatory Visit | Attending: Urology | Admitting: Urology

## 2022-08-10 ENCOUNTER — Other Ambulatory Visit: Payer: Self-pay

## 2022-08-10 VITALS — Resp 19 | Ht 70.0 in | Wt 213.0 lb

## 2022-08-10 DIAGNOSIS — I517 Cardiomegaly: Secondary | ICD-10-CM | POA: Insufficient documentation

## 2022-08-10 DIAGNOSIS — Z01818 Encounter for other preprocedural examination: Secondary | ICD-10-CM

## 2022-08-10 DIAGNOSIS — C61 Malignant neoplasm of prostate: Secondary | ICD-10-CM
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
## 2022-08-10 NOTE — Progress Notes (Signed)
  Radiation Oncology         [phone]) [phone] ________________________________  Name: Mccrae Speciale MRN: [id number]  Date: 08/10/2022  DOB: 1971/11/07  SIMULATION AND TREATMENT PLANNING NOTE PUBIC ARCH STUDY  CC:Lee, Joylene Igo, MD  Cher Nakai, MD  DIAGNOSIS:  51 y.o. gentleman with Stage T1c adenocarcinoma of the prostate with Gleason score of 3+3, and PSA of 4.6.  Oncology History   No history exists.      ICD-10-CM   1. Malignant neoplasm of prostate ([place])  C61       COMPLEX SIMULATION:  The patient presented today for evaluation for possible prostate seed implant. He was brought to the radiation planning suite and placed supine on the CT couch. A 3-dimensional image study set was obtained in upload to the planning computer. There, on each axial slice, I contoured the prostate gland. Then, using three-dimensional radiation planning tools I reconstructed the prostate in view of the structures from the transperineal needle pathway to assess for possible pubic arch interference. In doing so, I did not appreciate any pubic arch interference. Also, the patient's prostate volume was estimated based on the drawn structure. The volume was 24 cc.  Given the pubic arch appearance and prostate volume, patient remains a good candidate to proceed with prostate seed implant. Today, he freely provided informed written consent to proceed.    PLAN: The patient will undergo prostate seed implant.   ________________________________  Sheral Apley. Tammi Klippel, M.D.

## 2022-08-10 NOTE — Progress Notes (Signed)
Pre-seed appointment. I verified patient's identity and began nursing I interview. Patient is doing well. No discomfort reported at this time.  Meaningful use complete. No urinary management medications. Urology appt- None currently  Resp 19   Ht '5\' 10"'$  (1.778 m)   Wt 213 lb (96.6 kg)   BMI 30.56 kg/m

## 2022-08-31 ENCOUNTER — Encounter (HOSPITAL_BASED_OUTPATIENT_CLINIC_OR_DEPARTMENT_OTHER): Payer: Self-pay | Admitting: Urology

## 2022-08-31 NOTE — Progress Notes (Signed)
Spoke w/ via phone for pre-op [employer] needs dos----Istat               Lab results------EKG in epic COVID test -----patient states asymptomatic no test needed Arrive at -------1145 NPO after MN NO Solid Food.  Clear liquids from MN until---1045 Med rec completed Medications to take morning of surgery -----Omeprazole Diabetic medication -----Metformin (pre DM) Patient instructed no nail polish to be worn day of surgery Patient instructed to bring photo id and insurance card day of surgery Patient aware to have Driver (ride ) / caregiver    for 24 hours after surgery  Brendan Cook Patient Special Instructions -----N/A Pre-Op special Istructions -----N/A Patient verbalized understanding of instructions that were given at this phone interview. Patient denies shortness of breath, chest pain, fever, cough at this phone interview.

## 2022-09-06 ENCOUNTER — Telehealth: Payer: Self-pay | Admitting: *Deleted
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
## 2022-09-06 NOTE — Telephone Encounter (Signed)
Called patient to remind of procedure for 09-07-22, lvm for a return call

## 2022-09-07 ENCOUNTER — Encounter (HOSPITAL_BASED_OUTPATIENT_CLINIC_OR_DEPARTMENT_OTHER): Payer: Self-pay | Admitting: Urology

## 2022-09-07 ENCOUNTER — Ambulatory Visit (HOSPITAL_BASED_OUTPATIENT_CLINIC_OR_DEPARTMENT_OTHER)
Admission: RE | Admit: 2022-09-07 | Discharge: 2022-09-07 | Disposition: A | Discharge status: Home / Self Care | Payer: 59 | Attending: Urology | Admitting: Urology

## 2022-09-07 ENCOUNTER — Ambulatory Visit (HOSPITAL_BASED_OUTPATIENT_CLINIC_OR_DEPARTMENT_OTHER): Payer: 59 | Admitting: Anesthesiology

## 2022-09-07 ENCOUNTER — Encounter (HOSPITAL_BASED_OUTPATIENT_CLINIC_OR_DEPARTMENT_OTHER)
Admission: RE | Disposition: A | Discharge status: Home / Self Care | Payer: Self-pay | Source: Home / Self Care | Attending: Urology

## 2022-09-07 ENCOUNTER — Other Ambulatory Visit (HOSPITAL_COMMUNITY): Payer: Self-pay

## 2022-09-07 ENCOUNTER — Ambulatory Visit (HOSPITAL_COMMUNITY): Payer: 59

## 2022-09-07 DIAGNOSIS — Z87820 Personal history of traumatic brain injury: Secondary | ICD-10-CM | POA: Diagnosis not present

## 2022-09-07 DIAGNOSIS — K219 Gastro-esophageal reflux disease without esophagitis: Secondary | ICD-10-CM | POA: Insufficient documentation

## 2022-09-07 DIAGNOSIS — C61 Malignant neoplasm of prostate: Secondary | ICD-10-CM

## 2022-09-07 DIAGNOSIS — G4733 Obstructive sleep apnea (adult) (pediatric): Secondary | ICD-10-CM | POA: Diagnosis not present

## 2022-09-07 DIAGNOSIS — G4730 Sleep apnea, unspecified: Secondary | ICD-10-CM | POA: Insufficient documentation

## 2022-09-07 DIAGNOSIS — F17290 Nicotine dependence, other tobacco product, uncomplicated: Secondary | ICD-10-CM

## 2022-09-07 DIAGNOSIS — R7303 Prediabetes: Secondary | ICD-10-CM | POA: Diagnosis not present

## 2022-09-07 DIAGNOSIS — Z9989 Dependence on other enabling machines and devices: Secondary | ICD-10-CM

## 2022-09-07 DIAGNOSIS — I10 Essential (primary) hypertension: Secondary | ICD-10-CM | POA: Diagnosis not present

## 2022-09-07 DIAGNOSIS — F419 Anxiety disorder, unspecified: Secondary | ICD-10-CM | POA: Insufficient documentation

## 2022-09-07 DIAGNOSIS — Z01818 Encounter for other preprocedural examination: Secondary | ICD-10-CM

## 2022-09-07 HISTORY — PX: CYSTOSCOPY: SHX5120

## 2022-09-07 HISTORY — DX: Essential (primary) hypertension: I10

## 2022-09-07 HISTORY — PX: RADIOACTIVE SEED IMPLANT: SHX5150

## 2022-09-07 HISTORY — DX: Prediabetes: R73.03

## 2022-09-07 LAB — POCT I-STAT, CHEM 8
BUN: 5 mg/dL — ABNORMAL LOW (ref 6–20)
Calcium, Ion: 1.17 mmol/L (ref 1.15–1.40)
Chloride: 99 mmol/L (ref 98–111)
Creatinine, Ser: 0.8 mg/dL (ref 0.61–1.24)
Glucose, Bld: 95 mg/dL (ref 70–99)
HCT: 53 % — ABNORMAL HIGH (ref 39.0–52.0)
Hemoglobin: 18 g/dL — ABNORMAL HIGH (ref 13.0–17.0)
Potassium: 3.7 mmol/L (ref 3.5–5.1)
Sodium: 140 mmol/L (ref 135–145)
TCO2: 26 mmol/L (ref 22–32)

## 2022-09-07 LAB — URINALYSIS, ROUTINE W REFLEX MICROSCOPIC
Bilirubin Urine: NEGATIVE
Glucose, UA: NEGATIVE mg/dL
Hgb urine dipstick: NEGATIVE
Ketones, ur: NEGATIVE mg/dL
Leukocytes,Ua: NEGATIVE
Nitrite: NEGATIVE
Protein, ur: NEGATIVE mg/dL
Specific Gravity, Urine: 1.005 (ref 1.005–1.030)
pH: 7 (ref 5.0–8.0)

## 2022-09-07 SURGERY — INSERTION, RADIATION SOURCE, PROSTATE
Anesthesia: General | Site: Prostate

## 2022-09-07 MED ORDER — FENTANYL CITRATE (PF) 100 MCG/2ML IJ SOLN
INTRAMUSCULAR | Status: DC | PRN
  Administered 2022-09-07: 100 ug via INTRAVENOUS
  Administered 2022-09-07: 25 ug via INTRAVENOUS

## 2022-09-07 MED ORDER — AMISULPRIDE (ANTIEMETIC) 5 MG/2ML IV SOLN: 10.0000 mg | Freq: Once | INTRAVENOUS | Status: DC | PRN

## 2022-09-07 MED ORDER — SODIUM CHLORIDE 0.9 % IR SOLN
Status: DC | PRN
  Administered 2022-09-07: 300 mL

## 2022-09-07 MED ORDER — ARTIFICIAL TEARS OPHTHALMIC OINT
TOPICAL_OINTMENT | OPHTHALMIC | Status: AC
  Filled 2022-09-07: qty 7

## 2022-09-07 MED ORDER — MIDAZOLAM HCL 2 MG/2ML IJ SOLN
INTRAMUSCULAR | Status: AC
  Filled 2022-09-07: qty 2

## 2022-09-07 MED ORDER — PHENYLEPHRINE 80 MCG/ML (10ML) SYRINGE FOR IV PUSH (FOR BLOOD PRESSURE SUPPORT)
PREFILLED_SYRINGE | INTRAVENOUS | Status: DC | PRN
  Administered 2022-09-07 (×2): 80 ug via INTRAVENOUS
  Administered 2022-09-07 (×4): 160 ug via INTRAVENOUS
  Administered 2022-09-07: 80 ug via INTRAVENOUS
  Administered 2022-09-07: 160 ug via INTRAVENOUS
  Administered 2022-09-07: 80 ug via INTRAVENOUS

## 2022-09-07 MED ORDER — LIDOCAINE 2% (20 MG/ML) 5 ML SYRINGE
INTRAMUSCULAR | Status: DC | PRN
  Administered 2022-09-07: 60 mg via INTRAVENOUS

## 2022-09-07 MED ORDER — LACTATED RINGERS IV SOLN: INTRAVENOUS | Status: DC

## 2022-09-07 MED ORDER — PROPOFOL 10 MG/ML IV BOLUS
INTRAVENOUS | Status: AC
  Filled 2022-09-07: qty 20

## 2022-09-07 MED ORDER — SUGAMMADEX SODIUM 200 MG/2ML IV SOLN
INTRAVENOUS | Status: DC | PRN
  Administered 2022-09-07: 200 mg via INTRAVENOUS

## 2022-09-07 MED ORDER — ROCURONIUM BROMIDE 10 MG/ML (PF) SYRINGE
PREFILLED_SYRINGE | INTRAVENOUS | Status: DC | PRN
  Administered 2022-09-07: 10 mg via INTRAVENOUS
  Administered 2022-09-07: 80 mg via INTRAVENOUS

## 2022-09-07 MED ORDER — PROPOFOL 10 MG/ML IV BOLUS
INTRAVENOUS | Status: DC | PRN
  Administered 2022-09-07: 30 mg via INTRAVENOUS
  Administered 2022-09-07: 20 mg via INTRAVENOUS
  Administered 2022-09-07: 200 mg via INTRAVENOUS
  Administered 2022-09-07: 20 mg via INTRAVENOUS

## 2022-09-07 MED ORDER — OXYCODONE HCL 5 MG PO TABS: 5.0000 mg | ORAL_TABLET | Freq: Once | ORAL | Status: DC | PRN

## 2022-09-07 MED ORDER — PHENYLEPHRINE 80 MCG/ML (10ML) SYRINGE FOR IV PUSH (FOR BLOOD PRESSURE SUPPORT)
PREFILLED_SYRINGE | INTRAVENOUS | Status: AC
  Filled 2022-09-07: qty 10

## 2022-09-07 MED ORDER — MIDAZOLAM HCL 5 MG/5ML IJ SOLN
INTRAMUSCULAR | Status: DC | PRN
  Administered 2022-09-07: 2 mg via INTRAVENOUS

## 2022-09-07 MED ORDER — CIPROFLOXACIN IN D5W 400 MG/200ML IV SOLN
400.0000 mg | INTRAVENOUS | Status: AC
  Administered 2022-09-07: 400 mg via INTRAVENOUS

## 2022-09-07 MED ORDER — HYDROMORPHONE HCL 1 MG/ML IJ SOLN: 0.2500 mg | INTRAMUSCULAR | Status: DC | PRN

## 2022-09-07 MED ORDER — ROCURONIUM BROMIDE 10 MG/ML (PF) SYRINGE
PREFILLED_SYRINGE | INTRAVENOUS | Status: AC
  Filled 2022-09-07: qty 10

## 2022-09-07 MED ORDER — DEXAMETHASONE SODIUM PHOSPHATE 10 MG/ML IJ SOLN
INTRAMUSCULAR | Status: AC
  Filled 2022-09-07: qty 1

## 2022-09-07 MED ORDER — OXYCODONE HCL 5 MG/5ML PO SOLN: 5.0000 mg | Freq: Once | ORAL | Status: DC | PRN

## 2022-09-07 MED ORDER — FENTANYL CITRATE (PF) 100 MCG/2ML IJ SOLN
INTRAMUSCULAR | Status: AC
  Filled 2022-09-07: qty 2

## 2022-09-07 MED ORDER — LIDOCAINE HCL (PF) 2 % IJ SOLN
INTRAMUSCULAR | Status: AC
  Filled 2022-09-07: qty 5

## 2022-09-07 MED ORDER — ONDANSETRON HCL 4 MG/2ML IJ SOLN
INTRAMUSCULAR | Status: AC
  Filled 2022-09-07: qty 2

## 2022-09-07 MED ORDER — ONDANSETRON HCL 4 MG/2ML IJ SOLN: 4.0000 mg | Freq: Once | INTRAMUSCULAR | Status: DC | PRN

## 2022-09-07 MED ORDER — DEXAMETHASONE SODIUM PHOSPHATE 10 MG/ML IJ SOLN
INTRAMUSCULAR | Status: DC | PRN
  Administered 2022-09-07: 10 mg via INTRAVENOUS

## 2022-09-07 MED ORDER — ONDANSETRON HCL 4 MG/2ML IJ SOLN
INTRAMUSCULAR | Status: DC | PRN
  Administered 2022-09-07: 4 mg via INTRAVENOUS

## 2022-09-07 MED ORDER — IOHEXOL 300 MG/ML  SOLN
INTRAMUSCULAR | Status: DC | PRN
  Administered 2022-09-07: 7 mL

## 2022-09-07 MED ORDER — IBUPROFEN 800 MG PO TABS
800.0000 mg | ORAL_TABLET | Freq: Three times a day (TID) | ORAL | 0 refills | Status: DC | PRN
  Filled 2022-09-07: qty 30, 10d supply, fill #0

## 2022-09-07 MED ORDER — STERILE WATER FOR IRRIGATION IR SOLN
Status: DC | PRN
  Administered 2022-09-07: 3 mL

## 2022-09-07 MED ORDER — PROPOFOL 500 MG/50ML IV EMUL
INTRAVENOUS | Status: AC
  Filled 2022-09-07: qty 50

## 2022-09-07 MED ORDER — ACETAMINOPHEN 10 MG/ML IV SOLN: 1000.0000 mg | Freq: Once | INTRAVENOUS | Status: DC | PRN

## 2022-09-07 MED ORDER — CIPROFLOXACIN IN D5W 400 MG/200ML IV SOLN
INTRAVENOUS | Status: AC
  Filled 2022-09-07: qty 200

## 2022-09-07 SURGICAL SUPPLY — 33 items
BLADE CLIPPER SENSICLIP SURGIC (BLADE) ×2 IMPLANT
CATH FOLEY 2WAY SLVR  5CC 16FR (CATHETERS) ×4
CATH FOLEY 2WAY SLVR 5CC 16FR (CATHETERS) ×4 IMPLANT
CATH ROBINSON RED A/P 20FR (CATHETERS) ×2 IMPLANT
COVER BACK TABLE 60X90IN (DRAPES) ×2 IMPLANT
COVER MAYO STAND STRL (DRAPES) ×2 IMPLANT
DRSG TEGADERM 4X4.75 (GAUZE/BANDAGES/DRESSINGS) ×2 IMPLANT
DRSG TEGADERM 8X12 (GAUZE/BANDAGES/DRESSINGS) ×2 IMPLANT
GAUZE SPONGE 4X4 12PLY STRL LF (GAUZE/BANDAGES/DRESSINGS) IMPLANT
GLOVE BIO SURGEON STRL SZ 6.5 (GLOVE) ×2 IMPLANT
GLOVE BIO SURGEON STRL SZ7.5 (GLOVE) IMPLANT
GLOVE BIOGEL PI IND STRL 6.5 (GLOVE) IMPLANT
GLOVE BIOGEL PI IND STRL 7.0 (GLOVE) IMPLANT
GLOVE SURG ORTHO 8.5 STRL (GLOVE) IMPLANT
GOWN STRL REUS W/ TWL LRG LVL3 (GOWN DISPOSABLE) IMPLANT
GOWN STRL REUS W/TWL LRG LVL3 (GOWN DISPOSABLE) ×4 IMPLANT
GOWN STRL REUS W/TWL XL LVL3 (GOWN DISPOSABLE) IMPLANT
GRID BRACH TEMP 18GA 2.8X3X.75 (MISCELLANEOUS) ×2 IMPLANT
HOLDER FOLEY CATH W/STRAP (MISCELLANEOUS) ×2 IMPLANT
I-125 Seeds IMPLANT
IV NS 1000ML (IV SOLUTION) ×2
IV NS 1000ML BAXH (IV SOLUTION) ×2 IMPLANT
KIT TURNOVER CYSTO (KITS) ×2 IMPLANT
NDL BRACHY 18G 5PK (NEEDLE) ×8 IMPLANT
NDL PK MORGANSTERN STABILIZ (NEEDLE) ×2 IMPLANT
NEEDLE BRACHY 18G 5PK (NEEDLE) ×8 IMPLANT
NEEDLE PK MORGANSTERN STABILIZ (NEEDLE) ×2 IMPLANT
PACK CYSTO (CUSTOM PROCEDURE TRAY) ×2 IMPLANT
SHEATH ULTRASOUND LTX NONSTRL (SHEATH) IMPLANT
SYR 10ML LL (SYRINGE) ×2 IMPLANT
TOWEL OR 17X26 10 PK STRL BLUE (TOWEL DISPOSABLE) ×2 IMPLANT
UNDERPAD 30X36 HEAVY ABSORB (UNDERPADS AND DIAPERS) ×4 IMPLANT
WATER STERILE IRR 500ML POUR (IV SOLUTION) ×2 IMPLANT

## 2022-09-07 NOTE — Interval H&P Note (Signed)
History and Physical Interval Note:  09/07/2022 3:06 PM  Brendan Cook  has presented today for surgery, with the diagnosis of PROSTATE CANCER.  The various methods of treatment have been discussed with the patient and family. After consideration of risks, benefits and other options for treatment, the patient has consented to  Procedure(s): RADIOACTIVE SEED IMPLANT/BRACHYTHERAPY IMPLANT/ TRANSRECTAL ULTRASOUND OF PROSTATE (N/A) CYSTOSCOPY as a surgical intervention.  The patient's history has been reviewed, patient examined, no change in status, stable for surgery.  I have reviewed the patient's chart and labs.  Questions were answered to the patient's satisfaction.     Brendan Cook

## 2022-09-07 NOTE — H&P (Signed)
H&P Dictated- #[id number]- 09/06/2022  H&P unchanged  Patient understands risks/ benefits of TRUS, radioactive seed implant, wishes to proceed.
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
## 2022-09-07 NOTE — Anesthesia Postprocedure Evaluation (Signed)
Anesthesia Post Note  Patient: Kenroy Timberman  Procedure(s) Performed: RADIOACTIVE SEED IMPLANT/BRACHYTHERAPY IMPLANT/ TRANSRECTAL ULTRASOUND OF PROSTATE (Prostate) CYSTOSCOPY (Bladder)     Patient location during evaluation: PACU Anesthesia Type: General Level of consciousness: awake and alert and oriented Pain management: pain level controlled Vital Signs Assessment: post-procedure vital signs reviewed and stable Respiratory status: spontaneous breathing, nonlabored ventilation and respiratory function stable Cardiovascular status: blood pressure returned to baseline and stable Postop Assessment: no apparent nausea or vomiting Anesthetic complications: no   No notable events documented.  Last Vitals:  Vitals:   09/07/22 1650 09/07/22 1715  BP:  (!) 143/95  Pulse: (!) 105 94  Resp: 16 12  Temp:  36.5 C  SpO2: 98% 95%    Last Pain:  Vitals:   09/07/22 1715  TempSrc:   PainSc: 0-No pain                 Jatoria Kneeland A.

## 2022-09-07 NOTE — Anesthesia Procedure Notes (Signed)
Procedure Name: Intubation Date/Time: 09/07/2022 3:07 PM  Performed by: Rogers Blocker, CRNAPre-anesthesia Checklist: Patient identified, Emergency Drugs available, Suction available and Patient being monitored Patient Re-evaluated:Patient Re-evaluated prior to induction Oxygen Delivery Method: Circle System Utilized Preoxygenation: Pre-oxygenation with 100% oxygen Induction Type: IV induction Ventilation: Mask ventilation without difficulty Laryngoscope Size: Mac and 4 Grade View: Grade I Tube type: Oral Tube size: 7.0 mm Number of attempts: 1 Airway Equipment and Method: Stylet and Bite block Placement Confirmation: ETT inserted through vocal cords under direct vision, positive ETCO2 and breath sounds checked- equal and bilateral Secured at: 22 cm Tube secured with: Tape Dental Injury: Teeth and Oropharynx as per pre-operative assessment

## 2022-09-07 NOTE — Transfer of Care (Signed)
Immediate Anesthesia Transfer of Care Note  Patient: Brendan Cook  Procedure(s) Performed: RADIOACTIVE SEED IMPLANT/BRACHYTHERAPY IMPLANT/ TRANSRECTAL ULTRASOUND OF PROSTATE (Prostate) CYSTOSCOPY (Bladder)  Patient Location: PACU  Anesthesia Type:General  Level of Consciousness: awake, alert , oriented and patient cooperative  Airway & Oxygen Therapy: Patient Spontanous Breathing and Patient connected to face mask oxygen  Post-op Assessment: Report given to RN and Post -op Vital signs reviewed and stable  Post vital signs: Reviewed and stable  Last Vitals:  Vitals Value Taken Time  BP 137/96 09/07/22 1648  Temp    Pulse 104 09/07/22 1651  Resp 15 09/07/22 1651  SpO2 97 % 09/07/22 1651  Vitals shown include unvalidated device data.  Last Pain:  Vitals:   09/07/22 1216  TempSrc: Oral  PainSc: 0-No pain      Patients Stated Pain Goal: 5 (40/97/35 3299)  Complications: No notable events documented.

## 2022-09-07 NOTE — Progress Notes (Signed)
Radiation Oncology         [phone] ________________________________  Name: Brendan Cook MRN: [id number]  Date: 09/07/2022  DOB: 1971/06/14       Prostate Seed Implant  CC:Brendan Cook, Brendan Igo, MD  No ref. provider found  DIAGNOSIS: 51 y.o. gentleman with Stage T1c adenocarcinoma of the prostate with Gleason score of 3+3, and PSA of 4.6.  Oncology History  Malignant neoplasm of prostate (Brendan Cook)  03/28/2022 Cancer Staging   Staging form: Prostate, AJCC 8th Edition - Clinical stage from 03/28/2022: Stage I (cT1c, cN0, cM0, PSA: 4.6, Grade Group: 1) - Signed by [name], PA-C on 08/10/2022 Histopathologic type: Adenocarcinoma, NOS Stage prefix: Initial diagnosis Prostate specific antigen (PSA) range: Less than 10 Gleason primary pattern: 3 Gleason secondary pattern: 3 Gleason score: 6 Histologic grading system: 5 grade system Number of biopsy cores examined: 10 Number of biopsy cores positive: 3 Location of positive needle core biopsies: One side   06/19/2022 Initial Diagnosis   Malignant neoplasm of prostate (Brendan Cook)       ICD-10-CM   1. Pre-op testing  Z01.818 CBG per Guidelines for Diabetes Management for Patients Undergoing Surgery ([facility], [facility], and [facility] only)    CBG per protocol    I-Stat, Chem 8 on day of surgery per protocol    Urinalysis, Routine w reflex microscopic Urine, Clean Catch    Urinalysis, Routine w reflex microscopic Urine, Clean Catch    CBG per Guidelines for Diabetes Management for Patients Undergoing Surgery ([facility], [facility], and [facility] only)    CBG per protocol    I-Stat, Chem 8 on day of surgery per protocol    CANCELED: EKG 12 lead per protocol    2. Malignant neoplasm of prostate (Brendan Cook)  C61 Urinalysis, Routine w reflex microscopic Urine, Clean Catch    Urinalysis, Routine w reflex microscopic Urine, Clean Catch      PROCEDURE: Insertion of radioactive I-125 seeds into the prostate gland.  RADIATION DOSE: 145 Gy, definitive therapy.  TECHNIQUE: Brendan Cook was brought  to the operating room with the urologist. He was placed in the dorsolithotomy position. He was catheterized and a rectal tube was inserted. The perineum was shaved, prepped and draped. The ultrasound probe was then introduced into the rectum to see the prostate gland.  TREATMENT DEVICE: A needle grid was attached to the ultrasound probe stand and anchor needles were placed.  3D PLANNING: The prostate was imaged in 3D using a sagittal sweep of the prostate probe. These images were transferred to the planning computer. There, the prostate, urethra and rectum were defined on each axial reconstructed image. Then, the software created an optimized 3D plan and a few seed positions were adjusted. The quality of the plan was reviewed using [facility] information for the target and the following two organs at risk:  Urethra and Rectum.  Then the accepted plan was printed and handed off to the radiation therapist.  Under my supervision, the custom loading of the seeds and spacers was carried out and loaded into sealed vicryl sleeves.  These pre-loaded needles were then placed into the needle holder.Marland Kitchen  PROSTATE VOLUME STUDY:  Using transrectal ultrasound the volume of the prostate was verified to be 23 cc.  SPECIAL TREATMENT PROCEDURE/SUPERVISION AND HANDLING: The pre-loaded needles were then delivered under sagittal guidance. A total of 16 needles were used to deposit 59 seeds in the prostate gland. The individual seed activity was 0.338 mCi.  SpaceOAR:  No  COMPLEX SIMULATION: At the end of the procedure,  an anterior radiograph of the pelvis was obtained to document seed positioning and count. Cystoscopy was performed to check the urethra and bladder.  MICRODOSIMETRY: At the end of the procedure, the patient was emitting 0.05 mR/hr at 1 meter. Accordingly, he was considered safe for hospital discharge.  PLAN: The patient will return to the radiation oncology clinic for post implant CT dosimetry in three  weeks.   ________________________________  [name], M.D.

## 2022-09-07 NOTE — OR Nursing (Signed)
Dr. Nila Nephew here and signed consent at 1432, unable to access electronic H&P that was dictated on 09/06/2022. [facility] called for assistance in getting H&P in Epic. Dr. Nila Nephew completed a paper H&P on patient and placed in bedside chart.

## 2022-09-07 NOTE — Brief Op Note (Signed)
09/07/2022  4:55 PM  PATIENT:  Brendan Cook  51 y.o. male  PRE-OPERATIVE DIAGNOSIS:  PROSTATE CANCER  POST-OPERATIVE DIAGNOSIS:  PROSTATE CANCER  PROCEDURE:  Procedure(s) with comments: RADIOACTIVE SEED IMPLANT/BRACHYTHERAPY IMPLANT/ TRANSRECTAL ULTRASOUND OF PROSTATE (N/A) CYSTOSCOPY (N/A) - No seeds detected in bladder per Dr. Nila Nephew  SURGEON:  Surgeon(s) and Role:    * Joie Bimler, MD - Primary    * Tyler Pita, MD  PHYSICIAN ASSISTANT:   ASSISTANTS: none   ANESTHESIA:   general  EBL:  2 mL   BLOOD ADMINISTERED:none  DRAINS: none   LOCAL MEDICATIONS USED:  NONE  SPECIMEN:  No Specimen  DISPOSITION OF SPECIMEN:  N/A  COUNTS:  YES  TOURNIQUET:  * No tourniquets in log *  DICTATION: .Other Dictation: Dictation Number [id number]  PLAN OF CARE: Discharge to home after PACU  PATIENT DISPOSITION:  PACU - hemodynamically stable.   Delay start of Pharmacological VTE agent (>24hrs) due to surgical blood loss or risk of bleeding: not applicable

## 2022-09-07 NOTE — Discharge Instructions (Signed)
Radioactive Seed Implant Home Care Instructions   Activity:    Rest for the remainder of the day.  Do not drive or operate equipment today.  You may resume normal  activities in a few days as instructed by your physician, without risk of harmful radiation exposure to those around you, provided you follow the time and distance precautions on the Radiation Oncology Instruction Sheet.   Meals: Drink plenty of lipuids and eat light foods, such as gelatin or soup this evening .  You may return to normal meal plan tomorrow.  Return To Work: You may return to work as instructed by [employer].  Special Instruction:   If any seeds are found, use tweezers to pick up seeds and place in a glass container of any kind and bring to your physician's office.  Call your physician if any of these symptoms occur:  Persistent or heavy bleeding Urine stream diminishes or stops completely after catheter is removed Fever equal to or greater than 101 degrees F Cloudy urine with a strong foul odor Severe pain  You may feel some burning pain and/or hesitancy when you urinate after the catheter is removed.  These symptoms may increase over the next few weeks, but should diminish within forur to six weeks.  Applying moist heat to the lower abdomen or a hot tub bath may help relieve the pain.  If the discomfort becomes severe, please call your physician for additional medications.   Post Anesthesia Home Care Instructions  Activity: Get plenty of rest for the remainder of the day. A responsible adult should stay with you for 24 hours following the procedure.  For the next 24 hours, DO NOT: -Drive a car -Paediatric nurse -Drink alcoholic beverages -Take any medication unless instructed by your physician -Make any legal decisions or sign important papers.  Meals: Start with liquid foods such as gelatin or soup. Progress to regular foods as tolerated. Avoid greasy, spicy, heavy foods. If nausea and/or  vomiting occur, drink only clear liquids until the nausea and/or vomiting subsides. Call your physician if vomiting continues.  Special Instructions/Symptoms: Your throat may feel dry or sore from the anesthesia or the breathing tube placed in your throat during surgery. If this causes discomfort, gargle with warm salt water. The discomfort should disappear within 24 hours.

## 2022-09-08 NOTE — Op Note (Signed)
NAMEALOYSIUS, Brendan Cook MEDICAL RECORD NO: [id number] ACCOUNT NO: [id number] DATE OF BIRTH: Apr 10, 1971 FACILITY: [facility] LOCATION: [facility] PHYSICIAN: Joie Bimler, MD  Operative Report   DATE OF PROCEDURE: 09/07/2022  PREOPERATIVE DIAGNOSIS: Stage T1c adenocarcinoma of the prostate.  POSTOPERATIVE DIAGNOSIS: Stage T1c adenocarcinoma of the prostate.  PROCEDURE: Transrectal ultrasound of the prostate, intraprostatic seed implantation and cystoscopy.  SURGEON:  Joie Bimler, MD  COSURGEON:  Dr. Tyler Pita.  INDICATION FOR PROCEDURE:  The patient is a 51 year old white male with a recent diagnosis of prostate cancer.  He has been presented his options for definitive therapy and has opted for radioactive seed implantation.  He is brought in now for the  procedure.    PROCEDURE IN DETAIL: The patient was brought into the operating room and placed on the table in the supine position.  After adequate general anesthesia was achieved, Foley catheter was placed and then the patient was carefully placed in exaggerated  lithotomy with the Yellofin stirrups.  The scrotum was then elevated and taped in place onto the lower abdomen.  The perineum was shaved, prepped and draped for the performance of the procedure.  A red rubber catheter was then placed into the rectum to  relieve excess air.  Transrectal ultrasound probe was then placed and prostatic anchors were placed.  The prostate was then scanned from apex to the seminal vesicles.  The Nucletron program then had the scans marked into the program.  The individual  images were then reviewed by myself and Dr. Tammi Klippel and the prostatic borders, urethra and rectum were appropriately marked.  The computer program then calculated seed placement.  All of the individual images were then reviewed by myself, the physicist  and Dr. Tammi Klippel to ascertain seed placement.  Once we had a good coverage of the prostate with sparing of the urethra and rectum, seed  implantation was begun.  The bladder neck was identified with the Foley balloon in place and contrast in the balloon.   Needles were then placed from anterior to posterior on the prostate to encompass the prostate in its entirety.  No portion of the prostate was left untreated.  Once the final seeds were placed, the anchor needles were removed and pelvic x-ray was  obtained, showing good distribution of the seeds.  Foley catheter was then removed.  The patient was reprepped and draped and flexible cystoscopy performed, which showed no seeds within the bladder.  Rectal exam was performed and this did not reveal any  foreign bodies in the rectum.  Under sterile conditions, a 16 French Foley was placed to drain the bladder.  The patient tolerated all this well.  He was then awakened and taken to the recovery room in good condition.   SHW D: 09/07/2022 5:05:15 pm T: 09/08/2022 2:02:00 am  JOB: [id number]/ [id number]

## 2022-09-11 ENCOUNTER — Encounter (HOSPITAL_BASED_OUTPATIENT_CLINIC_OR_DEPARTMENT_OTHER): Payer: Self-pay | Admitting: Urology

## 2022-10-02 ENCOUNTER — Telehealth: Payer: Self-pay | Admitting: *Deleted

## 2022-10-02 NOTE — Progress Notes (Signed)
  Radiation Oncology         [phone] ________________________________  Name: Brendan Cook MRN: [id number]  Date: 10/03/2022  DOB: 12/31/1970  COMPLEX SIMULATION NOTE  NARRATIVE:  The patient was brought to the [facility] today following prostate seed implantation approximately one month ago.  Identity was confirmed.  All relevant records and images related to the planned course of therapy were reviewed.  Then, the patient was set-up supine.  CT images were obtained.  The CT images were loaded into the planning software.  Then the prostate and rectum were contoured.  Treatment planning then occurred.  The implanted iodine 125 seeds were identified by the physics staff for projection of radiation distribution  I have requested : 3D Simulation  I have requested a DVH of the following structures: Prostate and rectum.    ________________________________  [name], M.D.

## 2022-10-02 NOTE — Progress Notes (Signed)
Radiation Oncology         [phone] ________________________________  Name: Brendan Cook MRN: [id number]  Date: 10/03/2022  DOB: [birth year]  Post-Seed Follow-Up Visit Note  CC: Simone Curia, MD  Simone Curia, MD  Diagnosis:   51 y.o. gentleman Stage T1c adenocarcinoma of the prostate with Gleason score of 3+3, and PSA of 4.6.     ICD-10-CM   1. Malignant neoplasm of prostate ([facility])  C61       Interval Since Last Radiation:  3.5 weeks 09/07/22:  Insertion of radioactive I-125 seeds into the prostate gland; 145 Gy, definitive therapy without placement of SpaceOAR gel.  Narrative:  The patient returns today for routine follow-up.  He is complaining of increased urinary frequency and urinary hesitation symptoms. He filled out a questionnaire regarding urinary function today providing and overall IPSS score of 8 characterizing his symptoms as mild with increased frequency, nocturia x1 and a weaker flow of stream but nothing that he feels warrants medical treatment.  His pre-implant score was 5. He has not noticed any significant impact on his energy level and denies any abdominal pain or bowel symptoms.  Overall, he is quite pleased with his progress to date  ALLERGIES:  has No Known Allergies.  Meds: Current Outpatient Medications  Medication Sig Dispense Refill   ALPRAZolam (XANAX) 0.5 MG tablet Take 0.5 mg by mouth at bedtime as needed for anxiety or sleep.     fenofibrate 160 MG tablet Take 80 mg by mouth every morning.     fluticasone (FLONASE) 50 MCG/ACT nasal spray Place 1 spray into both nostrils daily.     ibuprofen (ADVIL) 800 MG tablet Take 1 tablet (800 mg total) by mouth every 8 (eight) hours as needed. 30 tablet 0   losartan-hydrochlorothiazide (HYZAAR) 100-25 MG tablet Take 1 tablet by mouth daily.     metFORMIN (GLUCOPHAGE-XR) 500 MG 24 hr tablet Take 1,000 mg by mouth 2 (two) times daily.     Multiple Vitamin (MULTIVITAMIN WITH MINERALS) TABS tablet Take 2 tablets by  mouth daily.     omeprazole (PRILOSEC OTC) 20 MG tablet Take 20 mg by mouth daily.     tadalafil (CIALIS) 10 MG tablet Take 10 mg by mouth daily.     No current facility-administered medications for this visit.    Physical Findings: In general this is a well appearing Caucasian male in no acute distress. He's alert and oriented x4 and appropriate throughout the examination. Cardiopulmonary assessment is negative for acute distress and he exhibits normal effort.   Lab Findings: Lab Results  Component Value Date   WBC 11.9 (H) 09/30/2014   HGB 18.0 (H) 09/07/2022   HCT 53.0 (H) 09/07/2022   MCV 80.3 09/30/2014   PLT 229 09/30/2014    Radiographic Findings:  Patient underwent CT imaging in our clinic for post implant dosimetry. The CT will be reviewed by Dr. Kathrynn Running to confirm there is an adequate distribution of radioactive seeds throughout the prostate gland and ensure that there are no seeds in or near the rectum.  We suspect the final radiation plan and dosimetry will show appropriate coverage of the prostate gland. He understands that we will call and inform him of any unexpected findings on further review of his imaging and dosimetry.  Impression/Plan: 51 y.o. gentleman Stage T1c adenocarcinoma of the prostate with Gleason score of 3+3, and PSA of 4.6.  The patient is recovering from the effects of radiation. His urinary symptoms should gradually improve over the  next 4-6 months. We talked about this today. He is encouraged by his improvement already and is otherwise pleased with his outcome. We also talked about long-term follow-up for prostate cancer following seed implant. He understands that ongoing PSA determinations and digital rectal exams will help perform surveillance to rule out disease recurrence. He has a follow up appointment scheduled with Dr. Saddie Benders on 10/13/22. He understands what to expect with his PSA measures. Patient was also educated today about some of the long-term  effects from radiation including a small risk for rectal bleeding and possibly erectile dysfunction. We talked about some of the general management approaches to these potential complications. However, I did encourage the patient to contact our office or return at any point if he has questions or concerns related to his previous radiation and prostate cancer.    Marguarite Arbour, PA-C

## 2022-10-02 NOTE — Telephone Encounter (Signed)
CALLED PATIENT TO REMIND OF POST SEED APPTS. FOR 10-03-22, LVM FOR A RETURN CALL

## 2022-10-03 ENCOUNTER — Ambulatory Visit
Admission: RE | Admit: 2022-10-03 | Discharge: 2022-10-03 | Disposition: A | Discharge status: Home / Self Care | Payer: 59 | Source: Ambulatory Visit | Attending: Radiation Oncology | Admitting: Radiation Oncology

## 2022-10-03 ENCOUNTER — Other Ambulatory Visit: Payer: Self-pay

## 2022-10-03 ENCOUNTER — Ambulatory Visit
Admission: RE | Admit: 2022-10-03 | Discharge: 2022-10-03 | Disposition: A | Discharge status: Home / Self Care | Payer: 59 | Source: Ambulatory Visit | Attending: Urology | Admitting: Urology

## 2022-10-03 ENCOUNTER — Encounter: Payer: Self-pay | Admitting: Urology

## 2022-10-03 VITALS — BP 133/97 | HR 96 | Temp 97.9°F | Resp 20 | Ht 70.0 in | Wt 210.6 lb

## 2022-10-03 DIAGNOSIS — Z79899 Other long term (current) drug therapy: Secondary | ICD-10-CM | POA: Insufficient documentation

## 2022-10-03 DIAGNOSIS — Z7984 Long term (current) use of oral hypoglycemic drugs: Secondary | ICD-10-CM | POA: Insufficient documentation

## 2022-10-03 DIAGNOSIS — Z923 Personal history of irradiation: Secondary | ICD-10-CM | POA: Insufficient documentation

## 2022-10-03 DIAGNOSIS — C61 Malignant neoplasm of prostate: Secondary | ICD-10-CM | POA: Insufficient documentation

## 2022-10-03 NOTE — Progress Notes (Signed)
Post seed appointment for a 51 y.o. gentleman Stage T1c adenocarcinoma of the prostate with Gleason score of 3+3, and PSA of 4.6.  I verified patient's identity and began nursing interview. Patient reports doing well. No issues at this time.  Meaningful use complete. I-PSS score of 8-mild Patient has a RX for Tamsulosin but is not currently taking it. Urology appt- Dec 1st, 2023 w/ Dr. Nila Nephew  BP (!) 133/97 (BP Location: Right Arm, Patient Position: Sitting, Cuff Size: Large)   Pulse 96   Temp 97.9 F (36.6 C) (Temporal)   Resp 20   Ht '5\' 10"'$  (1.778 m)   Wt 210 lb 9.6 oz (95.5 kg)   SpO2 99%   BMI 30.22 kg/m   This concludes this nursing appointment.  Leandra Kern, LPN

## 2022-10-10 ENCOUNTER — Encounter: Payer: Self-pay | Admitting: Radiation Oncology

## 2022-10-10 DIAGNOSIS — C61 Malignant neoplasm of prostate: Secondary | ICD-10-CM | POA: Diagnosis not present

## 2022-10-10 NOTE — Progress Notes (Signed)
  Radiation Oncology         [phone] ________________________________  Name: Brendan Cook MRN: [id number]  Date: 10/10/2022  DOB: 1970/12/04  3D Planning Note   Prostate Brachytherapy Post-Implant Dosimetry  Diagnosis: 51 y.o. gentleman with Stage T1c adenocarcinoma of the prostate with Gleason score of 3+3, and PSA of 4.6.  Narrative: On a previous date, Brendan Cook returned following prostate seed implantation for post implant planning. He underwent CT scan complex simulation to delineate the three-dimensional structures of the pelvis and demonstrate the radiation distribution.  Since that time, the seed localization, and complex isodose planning with dose volume histograms have now been completed.  Results:   Prostate Coverage - The dose of radiation delivered to the 90% or more of the prostate gland (D90) was 113.04% of the prescription dose. This exceeds our goal of greater than 90%. Rectal Sparing - The volume of rectal tissue receiving the prescription dose or higher was 0.0 cc. This falls under our thresholds tolerance of 1.0 cc.  Impression: The prostate seed implant appears to show adequate target coverage and appropriate rectal sparing.  Plan:  The patient will continue to follow with urology for ongoing PSA determinations. I would anticipate a high likelihood for local tumor control with minimal risk for rectal morbidity.  ________________________________  [name], M.D.

## 2022-10-30 ENCOUNTER — Other Ambulatory Visit: Payer: Self-pay

## 2022-12-07 ENCOUNTER — Encounter: Payer: Self-pay | Admitting: *Deleted

## 2023-08-29 ENCOUNTER — Other Ambulatory Visit: Payer: Self-pay | Admitting: Otolaryngology

## 2023-08-29 ENCOUNTER — Other Ambulatory Visit: Payer: Self-pay

## 2023-08-29 ENCOUNTER — Encounter (HOSPITAL_COMMUNITY): Payer: Self-pay | Admitting: Otolaryngology

## 2023-08-29 NOTE — Progress Notes (Signed)
PCP - Simone Curia, MD  Cardiologist - denies  PPM/ICD - denies Device Orders - n/a Rep Notified - n/a  Chest x-ray - denies EKG - 08-10-22 Stress Test - denies ECHO - denies Cardiac Cath - denies  CPAP - does not tolerate    Fasting Blood Sugar - MD monitors A1c  Checks Blood Sugar does not check at home  Blood Thinner Instructions: denies Aspirin Instructions: n/a  ERAS Protcol - clear liquids until 9:00  COVID TEST- no  Anesthesia review: no  Patient verbally denies any shortness of breath, fever, cough and chest pain during phone call   -------------  SDW INSTRUCTIONS given:  Your procedure is scheduled on September 03, 2023.  Report to [facility] Main Entrance "A" at 9:30 A.M., and check in at the Admitting office.  Call this number if you have problems the morning of surgery:  [phone]   Remember:  Do not eat after midnight the night before your surgery  You may drink clear liquids until 9:00 the morning of your surgery.   Clear liquids allowed are: Water, Non-Citrus Juices (without pulp), Carbonated Beverages, Clear Tea, Black Coffee Only, and Gatorade    Take these medicines the morning of surgery with A SIP OF WATER  anastrozole (ARIMIDEX)  buPROPion (WELLBUTRIN XL)  cetirizine (ZYRTEC)  fluticasone (FLONASE)  omeprazole (PRILOSEC OTC)  metFORMIN (GLUCOPHAGE-XR)    IF  NEEDED ALPRAZolam (XANAX)    As of today, STOP taking any Aspirin (unless otherwise instructed by your surgeon) Aleve, Naproxen, Ibuprofen, Motrin, Advil, Goody's, BC's, all herbal medications, fish oil, and all vitamins. WHAT DO I DO ABOUT MY DIABETES MEDICATION?   Do not take oral diabetes medicines (pills) the morning of surgery.  metFORMIN (GLUCOPHAGE-XR)  The day of surgery, do not take other diabetes injectables, including Byetta (exenatide), Bydureon (exenatide ER), Victoza (liraglutide), or Trulicity (dulaglutide).  If your CBG is greater than 220 mg/dL, you may take   of your sliding scale (correction) dose of insulin.   HOW TO MANAGE YOUR DIABETES BEFORE AND AFTER SURGERY  Why is it important to control my blood sugar before and after surgery? Improving blood sugar levels before and after surgery helps healing and can limit problems. A way of improving blood sugar control is eating a healthy diet by:  Eating less sugar and carbohydrates  Increasing activity/exercise  Talking with your doctor about reaching your blood sugar goals High blood sugars (greater than 180 mg/dL) can raise your risk of infections and slow your recovery, so you will need to focus on controlling your diabetes during the weeks before surgery. Make sure that the doctor who takes care of your diabetes knows about your planned surgery including the date and location.  How do I manage my blood sugar before surgery? Check your blood sugar at least 4 times a day, starting 2 days before surgery, to make sure that the level is not too high or low.  Check your blood sugar the morning of your surgery when you wake up and every 2 hours until you get to the Short Stay unit.  If your blood sugar is less than 70 mg/dL, you will need to treat for low blood sugar: Do not take insulin. Treat a low blood sugar (less than 70 mg/dL) with  cup of clear juice (cranberry or apple), 4 glucose tablets, OR glucose gel. Recheck blood sugar in 15 minutes after treatment (to make sure it is greater than 70 mg/dL). If your blood sugar is not  greater than 70 mg/dL on recheck, call [phone] for further instructions. Report your blood sugar to the short stay nurse when you get to Short Stay.  If you are admitted to the hospital after surgery: Your blood sugar will be checked by the staff and you will probably be given insulin after surgery (instead of oral diabetes medicines) to make sure you have good blood sugar levels. The goal for blood sugar control after surgery is 80-180 mg/dL.                       Do not wear jewelry, make up, or nail polish            Do not wear lotions, powders, perfumes/colognes, or deodorant.            Do not shave 48 hours prior to surgery.  Men may shave face and neck.            Do not bring valuables to the hospital.            [facility] is not responsible for any belongings or valuables.  Do NOT Smoke (Tobacco/Vaping) 24 hours prior to your procedure If you use a CPAP at night, you may bring all equipment for your overnight stay.   Contacts, glasses, dentures or bridgework may not be worn into surgery.      For patients admitted to the hospital, discharge time will be determined by your treatment team.   Patients discharged the day of surgery will not be allowed to drive home, and someone needs to stay with them for 24 hours.    Special instructions:   [facility]- Preparing For Surgery  Before surgery, you can play an important role. Because skin is not sterile, your skin needs to be as free of germs as possible. You can reduce the number of germs on your skin by washing with CHG (chlorahexidine gluconate) Soap before surgery.  CHG is an antiseptic cleaner which kills germs and bonds with the skin to continue killing germs even after washing.    Oral Hygiene is also important to reduce your risk of infection.  Remember - BRUSH YOUR TEETH THE MORNING OF SURGERY WITH YOUR REGULAR TOOTHPASTE  Please do not use if you have an allergy to CHG or antibacterial soaps. If your skin becomes reddened/irritated stop using the CHG.  Do not shave (including legs and underarms) for at least 48 hours prior to first CHG shower. It is OK to shave your face.  Please follow these instructions carefully.   Shower the NIGHT BEFORE SURGERY and the MORNING OF SURGERY with DIAL Soap.   Pat yourself dry with a CLEAN TOWEL.  Wear CLEAN PAJAMAS to bed the night before surgery  Place CLEAN SHEETS on your bed the night of your first shower and DO NOT SLEEP WITH  PETS.   Day of Surgery: Please shower morning of surgery  Wear Clean/Comfortable clothing the morning of surgery Do not apply any deodorants/lotions.   Remember to brush your teeth WITH YOUR REGULAR TOOTHPASTE.   Questions were answered. Patient verbalized understanding of instructions.

## 2023-08-30 ENCOUNTER — Other Ambulatory Visit: Payer: Self-pay | Admitting: Otolaryngology

## 2023-09-03 ENCOUNTER — Ambulatory Visit (HOSPITAL_COMMUNITY): Payer: 59

## 2023-09-03 ENCOUNTER — Encounter (HOSPITAL_COMMUNITY): Payer: Self-pay | Admitting: Otolaryngology

## 2023-09-03 ENCOUNTER — Other Ambulatory Visit: Payer: Self-pay

## 2023-09-03 ENCOUNTER — Ambulatory Visit (HOSPITAL_COMMUNITY)
Admission: RE | Admit: 2023-09-03 | Discharge: 2023-09-03 | Disposition: A | Discharge status: Home / Self Care | Payer: 59 | Attending: Otolaryngology | Admitting: Otolaryngology

## 2023-09-03 ENCOUNTER — Encounter (HOSPITAL_COMMUNITY)
Admission: RE | Disposition: A | Discharge status: Home / Self Care | Payer: Self-pay | Source: Home / Self Care | Attending: Otolaryngology

## 2023-09-03 DIAGNOSIS — M1990 Unspecified osteoarthritis, unspecified site: Secondary | ICD-10-CM | POA: Diagnosis not present

## 2023-09-03 DIAGNOSIS — Z79899 Other long term (current) drug therapy: Secondary | ICD-10-CM | POA: Insufficient documentation

## 2023-09-03 DIAGNOSIS — I10 Essential (primary) hypertension: Secondary | ICD-10-CM | POA: Insufficient documentation

## 2023-09-03 DIAGNOSIS — G4733 Obstructive sleep apnea (adult) (pediatric): Secondary | ICD-10-CM | POA: Diagnosis present

## 2023-09-03 DIAGNOSIS — K219 Gastro-esophageal reflux disease without esophagitis: Secondary | ICD-10-CM | POA: Insufficient documentation

## 2023-09-03 HISTORY — DX: Malignant (primary) neoplasm, unspecified: C80.1

## 2023-09-03 HISTORY — PX: DRUG INDUCED ENDOSCOPY: SHX6808

## 2023-09-03 LAB — CBC
HCT: 49.8 % (ref 39.0–52.0)
Hemoglobin: 15.7 g/dL (ref 13.0–17.0)
MCH: 26.8 pg (ref 26.0–34.0)
MCHC: 31.5 g/dL (ref 30.0–36.0)
MCV: 85.1 fL (ref 80.0–100.0)
Platelets: 226 10*3/uL (ref 150–400)
RBC: 5.85 MIL/uL — ABNORMAL HIGH (ref 4.22–5.81)
RDW: 14.5 % (ref 11.5–15.5)
WBC: 4.1 10*3/uL (ref 4.0–10.5)
nRBC: 0 % (ref 0.0–0.2)

## 2023-09-03 LAB — BASIC METABOLIC PANEL
Anion gap: 7 (ref 5–15)
BUN: 7 mg/dL (ref 6–20)
CO2: 24 mmol/L (ref 22–32)
Calcium: 8.5 mg/dL — ABNORMAL LOW (ref 8.9–10.3)
Chloride: 106 mmol/L (ref 98–111)
Creatinine, Ser: 0.74 mg/dL (ref 0.61–1.24)
GFR, Estimated: >60 mL/min (ref >60)
Glucose, Bld: 95 mg/dL (ref 70–99)
Potassium: 3.6 mmol/L (ref 3.5–5.1)
Sodium: 137 mmol/L (ref 135–145)

## 2023-09-03 SURGERY — DRUG INDUCED SLEEP ENDOSCOPY
Anesthesia: Monitor Anesthesia Care

## 2023-09-03 MED ORDER — PROPOFOL 500 MG/50ML IV EMUL
INTRAVENOUS | Status: AC
  Filled 2023-09-03: qty 50

## 2023-09-03 MED ORDER — CHLORHEXIDINE GLUCONATE 0.12 % MT SOLN
15.0000 mL | Freq: Once | OROMUCOSAL | Status: AC
  Administered 2023-09-03: 15 mL via OROMUCOSAL
  Filled 2023-09-03: qty 15

## 2023-09-03 MED ORDER — SODIUM CHLORIDE 0.9 % IV SOLN: INTRAVENOUS | Status: DC | PRN

## 2023-09-03 MED ORDER — PROPOFOL 10 MG/ML IV BOLUS
INTRAVENOUS | Status: AC
  Filled 2023-09-03: qty 20

## 2023-09-03 MED ORDER — OXYMETAZOLINE HCL 0.05 % NA SOLN
NASAL | Status: AC
  Filled 2023-09-03: qty 30

## 2023-09-03 MED ORDER — PROPOFOL 500 MG/50ML IV EMUL
INTRAVENOUS | Status: DC | PRN
  Administered 2023-09-03: 150 ug/kg/min via INTRAVENOUS

## 2023-09-03 MED ORDER — ORAL CARE MOUTH RINSE: 15.0000 mL | Freq: Once | OROMUCOSAL | Status: AC

## 2023-09-03 MED ORDER — OXYMETAZOLINE HCL 0.05 % NA SOLN
NASAL | Status: DC | PRN
  Administered 2023-09-03: 1

## 2023-09-03 MED ORDER — LACTATED RINGERS IV SOLN: INTRAVENOUS | Status: DC

## 2023-09-03 SURGICAL SUPPLY — 13 items
BAG COUNTER SPONGE SURGICOUNT (BAG) IMPLANT
BAG SPNG CNTER NS LX DISP (BAG)
CANISTER SUCT 1200ML W/VALVE (MISCELLANEOUS) ×1 IMPLANT
GLOVE BIO SURGEON STRL SZ7.5 (GLOVE) ×1 IMPLANT
KIT BASIN OR (CUSTOM PROCEDURE TRAY) ×1 IMPLANT
NDL PRECISIONGLIDE 27X1.5 (NEEDLE) IMPLANT
NEEDLE PRECISIONGLIDE 27X1.5 (NEEDLE)
PATTIES SURGICAL .5 X3 (DISPOSABLE) ×1 IMPLANT
SHEET MEDIUM DRAPE 40X70 STRL (DRAPES) IMPLANT
SOL ANTI FOG 6CC (MISCELLANEOUS) ×1 IMPLANT
SYR CONTROL 10ML LL (SYRINGE) IMPLANT
TOWEL GREEN STERILE FF (TOWEL DISPOSABLE) ×1 IMPLANT
TUBE CONNECTING 20X1/4 (TUBING) ×1 IMPLANT

## 2023-09-03 NOTE — Anesthesia Preprocedure Evaluation (Addendum)
Anesthesia Evaluation  Patient identified by MRN, date of birth, ID band Patient awake    Reviewed: Allergy & Precautions, H&P , NPO status , Patient's Chart, lab work & pertinent test results  Airway Mallampati: II  TM Distance: >3 FB Neck ROM: Full    Dental no notable dental hx. (+) Teeth Intact, Dental Advisory Given   Pulmonary sleep apnea and Continuous Positive Airway Pressure Ventilation    Pulmonary exam normal breath sounds clear to auscultation       Cardiovascular hypertension, Pt. on medications  Rhythm:Regular Rate:Normal     Neuro/Psych   Anxiety     negative neurological ROS     GI/Hepatic Neg liver ROS,GERD  Medicated,,  Endo/Other  negative endocrine ROS    Renal/GU negative Renal ROS  negative genitourinary   Musculoskeletal  (+) Arthritis , Osteoarthritis,    Abdominal   Peds  Hematology negative hematology ROS (+)   Anesthesia Other Findings   Reproductive/Obstetrics negative OB ROS                             Anesthesia Physical Anesthesia Plan  ASA: 3  Anesthesia Plan: MAC   Post-op Pain Management: Minimal or no pain anticipated   Induction: Intravenous  PONV Risk Score and Plan: 1 and Propofol infusion  Airway Management Planned: Natural Airway and Nasal Cannula  Additional Equipment:   Intra-op Plan:   Post-operative Plan:   Informed Consent: I have reviewed the patients History and Physical, chart, labs and discussed the procedure including the risks, benefits and alternatives for the proposed anesthesia with the patient or authorized representative who has indicated his/her understanding and acceptance.     Dental advisory given  Plan Discussed with: CRNA  Anesthesia Plan Comments:        Anesthesia Quick Evaluation
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
## 2023-09-03 NOTE — Transfer of Care (Signed)
Immediate Anesthesia Transfer of Care Note  Patient: Brendan Cook  Procedure(s) Performed: DRUG INDUCED SLEEP ENDOSCOPY  Patient Location: PACU  Anesthesia Type:MAC  Level of Consciousness: awake and alert   Airway & Oxygen Therapy: Patient Spontanous Breathing and Patient connected to nasal cannula oxygen  Post-op Assessment: Report given to RN and Post -op Vital signs reviewed and stable  Post vital signs: Reviewed and stable  Last Vitals:  Vitals Value Taken Time  BP    Temp    Pulse 90 09/03/23 1228  Resp    SpO2 95 % 09/03/23 1228  Vitals shown include unfiled device data.  Last Pain:  Vitals:   09/03/23 1035  PainSc: 0-No pain         Complications: No notable events documented.

## 2023-09-03 NOTE — Anesthesia Postprocedure Evaluation (Signed)
Anesthesia Post Note  Patient: Brendan Cook  Procedure(s) Performed: DRUG INDUCED SLEEP ENDOSCOPY     Patient location during evaluation: PACU Anesthesia Type: MAC Level of consciousness: awake and alert Pain management: pain level controlled Vital Signs Assessment: post-procedure vital signs reviewed and stable Respiratory status: spontaneous breathing, nonlabored ventilation and respiratory function stable Cardiovascular status: stable and blood pressure returned to baseline Postop Assessment: no apparent nausea or vomiting Anesthetic complications: no  No notable events documented.  Last Vitals:  Vitals:   09/03/23 1245 09/03/23 1300  BP: (!) 129/91 (!) 127/92  Pulse: 79 78  Resp: 15 15  Temp:  36.6 C  SpO2: 96% 97%    Last Pain:  Vitals:   09/03/23 1300  PainSc: 0-No pain                 Brendan Cook,W. EDMOND

## 2023-09-03 NOTE — Op Note (Signed)
Preop diagnosis: Obstructive sleep apnea Postop diagnosis: same Procedure: Drug-induced sleep endoscopy Surgeon: Jenne Pane Anesth: IV sedation Compl: None Findings: There is 100% anterior-posterior collapse at the velum making him a candidate for hypoglossal nerve stimulator placement.  There was also moderate collapse at the tongue base. Description:  After discussing risks, benefits, and alternatives, the patient was brought to the operative suite and placed on the operative table in the supine position.  Anesthesia was induced and the patient was given light sedation to simulate natural sleep. When the proper level was reached, an Afrin-soaked pledget was placed in the right nasal passage for a couple of minutes and then removed.  The fiberoptic laryngoscope was then passed to view the pharynx and larynx.  Findings are noted above and the exam was recorded.  After completion, the scope was removed and the patient was returned to anesthesia for wakeup and was moved to the recovery room in stable condition.

## 2023-09-03 NOTE — Brief Op Note (Signed)
09/03/2023  12:22 PM  PATIENT:  Brendan Cook  52 y.o. male  PRE-OPERATIVE DIAGNOSIS:  Obstructive sleep apnea  POST-OPERATIVE DIAGNOSIS:  Obstructive sleep apnea  PROCEDURE:  Procedure(s): DRUG INDUCED SLEEP ENDOSCOPY (N/A)  SURGEON:  Surgeons and Role:    Christia Reading, MD - Primary  PHYSICIAN ASSISTANT:   ASSISTANTS: none   ANESTHESIA:   IV sedation  EBL:  None   BLOOD ADMINISTERED:none  DRAINS: none   LOCAL MEDICATIONS USED:  NONE  SPECIMEN:  No Specimen  DISPOSITION OF SPECIMEN:  N/A  COUNTS:  YES  TOURNIQUET:  * No tourniquets in log *  DICTATION: .Note written in EPIC  PLAN OF CARE: Discharge to home after PACU  PATIENT DISPOSITION:  PACU - hemodynamically stable.   Delay start of Pharmacological VTE agent (>24hrs) due to surgical blood loss or risk of bleeding: no

## 2023-09-04 ENCOUNTER — Encounter (HOSPITAL_COMMUNITY): Payer: Self-pay | Admitting: Otolaryngology

## 2024-12-08 ENCOUNTER — Other Ambulatory Visit: Payer: Self-pay

## 2024-12-09 ENCOUNTER — Other Ambulatory Visit: Payer: Self-pay
